# Patient Record
Sex: Female | Born: 1935 | Race: White | Hispanic: No | Smoking: Never smoker
Health system: Southern US, Community
[De-identification: ages and names within clinical notes are randomized; demographics above are authoritative.]

## PROBLEM LIST (undated history)

## (undated) DIAGNOSIS — G20 Parkinson's disease: Secondary | ICD-10-CM

## (undated) DIAGNOSIS — K279 Peptic ulcer, site unspecified, unspecified as acute or chronic, without hemorrhage or perforation: Secondary | ICD-10-CM

## (undated) DIAGNOSIS — R011 Cardiac murmur, unspecified: Secondary | ICD-10-CM

## (undated) DIAGNOSIS — I4891 Unspecified atrial fibrillation: Secondary | ICD-10-CM

## (undated) DIAGNOSIS — M810 Age-related osteoporosis without current pathological fracture: Secondary | ICD-10-CM

## (undated) DIAGNOSIS — I10 Essential (primary) hypertension: Secondary | ICD-10-CM

## (undated) DIAGNOSIS — M1990 Unspecified osteoarthritis, unspecified site: Secondary | ICD-10-CM

## (undated) DIAGNOSIS — R0602 Shortness of breath: Secondary | ICD-10-CM

## (undated) DIAGNOSIS — G709 Myoneural disorder, unspecified: Secondary | ICD-10-CM

## (undated) DIAGNOSIS — G20A1 Parkinson's disease without dyskinesia, without mention of fluctuations: Secondary | ICD-10-CM

## (undated) DIAGNOSIS — M109 Gout, unspecified: Secondary | ICD-10-CM

## (undated) DIAGNOSIS — I2510 Atherosclerotic heart disease of native coronary artery without angina pectoris: Secondary | ICD-10-CM

## (undated) DIAGNOSIS — E049 Nontoxic goiter, unspecified: Secondary | ICD-10-CM

## (undated) DIAGNOSIS — I499 Cardiac arrhythmia, unspecified: Secondary | ICD-10-CM

## (undated) DIAGNOSIS — R413 Other amnesia: Secondary | ICD-10-CM

## (undated) DIAGNOSIS — I509 Heart failure, unspecified: Secondary | ICD-10-CM

## (undated) HISTORY — PX: CHOLECYSTECTOMY: SHX55

## (undated) HISTORY — DX: Age-related osteoporosis without current pathological fracture: M81.0

## (undated) HISTORY — DX: Other amnesia: R41.3

## (undated) HISTORY — PX: APPENDECTOMY: SHX54

## (undated) HISTORY — DX: Unspecified atrial fibrillation: I48.91

## (undated) HISTORY — DX: Nontoxic goiter, unspecified: E04.9

## (undated) HISTORY — PX: OTHER SURGICAL HISTORY: SHX169

## (undated) HISTORY — PX: HIATAL HERNIA REPAIR: SHX195

## (undated) HISTORY — DX: Hypomagnesemia: E83.42

## (undated) HISTORY — DX: Peptic ulcer, site unspecified, unspecified as acute or chronic, without hemorrhage or perforation: K27.9

## (undated) HISTORY — PX: TONSILLECTOMY: SUR1361

## (undated) HISTORY — PX: RECTOCELE REPAIR: SHX761

## (undated) HISTORY — DX: Gout, unspecified: M10.9

## (undated) HISTORY — PX: ABDOMINAL HYSTERECTOMY: SHX81

## (undated) HISTORY — PX: CATARACT EXTRACTION, BILATERAL: SHX1313

## (undated) HISTORY — PX: KYPHOPLASTY: SHX5884

## (undated) HISTORY — PX: PACEMAKER PLACEMENT: SHX43

## (undated) HISTORY — DX: Unspecified osteoarthritis, unspecified site: M19.90

## (undated) HISTORY — PX: INSERT / REPLACE / REMOVE PACEMAKER: SUR710

---

## 1998-01-02 ENCOUNTER — Other Ambulatory Visit: Admission: RE | Admit: 1998-01-02 | Discharge: 1998-01-02 | Payer: Self-pay | Admitting: Family Medicine

## 1998-02-10 ENCOUNTER — Other Ambulatory Visit: Admission: RE | Admit: 1998-02-10 | Discharge: 1998-02-10 | Payer: Self-pay | Admitting: Family Medicine

## 1998-05-26 ENCOUNTER — Other Ambulatory Visit: Admission: RE | Admit: 1998-05-26 | Discharge: 1998-05-26 | Payer: Self-pay | Admitting: Obstetrics and Gynecology

## 1999-06-09 ENCOUNTER — Other Ambulatory Visit: Admission: RE | Admit: 1999-06-09 | Discharge: 1999-06-09 | Payer: Self-pay | Admitting: Obstetrics and Gynecology

## 2001-08-28 ENCOUNTER — Other Ambulatory Visit: Admission: RE | Admit: 2001-08-28 | Discharge: 2001-08-28 | Payer: Self-pay | Admitting: Obstetrics and Gynecology

## 2002-08-30 ENCOUNTER — Other Ambulatory Visit: Admission: RE | Admit: 2002-08-30 | Discharge: 2002-08-30 | Payer: Self-pay | Admitting: Obstetrics and Gynecology

## 2002-09-11 ENCOUNTER — Ambulatory Visit (HOSPITAL_COMMUNITY): Admission: RE | Admit: 2002-09-11 | Discharge: 2002-09-11 | Payer: Self-pay | Admitting: Cardiology

## 2003-07-16 ENCOUNTER — Other Ambulatory Visit: Admission: RE | Admit: 2003-07-16 | Discharge: 2003-07-16 | Payer: Self-pay | Admitting: Obstetrics and Gynecology

## 2003-10-17 ENCOUNTER — Inpatient Hospital Stay (HOSPITAL_COMMUNITY): Admission: EM | Admit: 2003-10-17 | Discharge: 2003-10-21 | Payer: Self-pay | Admitting: Emergency Medicine

## 2003-12-06 ENCOUNTER — Ambulatory Visit (HOSPITAL_COMMUNITY): Admission: RE | Admit: 2003-12-06 | Discharge: 2003-12-06 | Payer: Self-pay | Admitting: Gastroenterology

## 2003-12-06 ENCOUNTER — Encounter (INDEPENDENT_AMBULATORY_CARE_PROVIDER_SITE_OTHER): Payer: Self-pay | Admitting: Specialist

## 2004-06-26 ENCOUNTER — Encounter: Admission: RE | Admit: 2004-06-26 | Discharge: 2004-06-26 | Payer: Self-pay | Admitting: Obstetrics and Gynecology

## 2005-07-16 ENCOUNTER — Other Ambulatory Visit: Admission: RE | Admit: 2005-07-16 | Discharge: 2005-07-16 | Payer: Self-pay | Admitting: Obstetrics and Gynecology

## 2005-08-09 ENCOUNTER — Encounter: Admission: RE | Admit: 2005-08-09 | Discharge: 2005-08-09 | Payer: Self-pay | Admitting: *Deleted

## 2006-11-21 ENCOUNTER — Encounter: Admission: RE | Admit: 2006-11-21 | Discharge: 2006-11-21 | Payer: Self-pay | Admitting: Obstetrics and Gynecology

## 2007-12-12 ENCOUNTER — Encounter: Admission: RE | Admit: 2007-12-12 | Discharge: 2007-12-12 | Payer: Self-pay | Admitting: Obstetrics and Gynecology

## 2008-09-10 ENCOUNTER — Encounter: Payer: Self-pay | Admitting: Obstetrics and Gynecology

## 2008-09-12 ENCOUNTER — Ambulatory Visit (HOSPITAL_COMMUNITY): Admission: RE | Admit: 2008-09-12 | Discharge: 2008-09-12 | Payer: Self-pay | Admitting: Orthopedic Surgery

## 2008-09-18 ENCOUNTER — Ambulatory Visit (HOSPITAL_COMMUNITY): Admission: RE | Admit: 2008-09-18 | Discharge: 2008-09-18 | Payer: Self-pay | Admitting: Interventional Radiology

## 2008-09-20 ENCOUNTER — Encounter (INDEPENDENT_AMBULATORY_CARE_PROVIDER_SITE_OTHER): Payer: Self-pay | Admitting: Interventional Radiology

## 2008-09-20 ENCOUNTER — Ambulatory Visit (HOSPITAL_COMMUNITY): Admission: RE | Admit: 2008-09-20 | Discharge: 2008-09-20 | Payer: Self-pay | Admitting: Interventional Radiology

## 2008-10-10 ENCOUNTER — Encounter: Payer: Self-pay | Admitting: Interventional Radiology

## 2008-10-22 ENCOUNTER — Emergency Department (HOSPITAL_COMMUNITY): Admission: EM | Admit: 2008-10-22 | Discharge: 2008-10-22 | Payer: Self-pay | Admitting: Emergency Medicine

## 2010-04-01 ENCOUNTER — Encounter: Admission: RE | Admit: 2010-04-01 | Discharge: 2010-04-01 | Payer: Self-pay | Admitting: Internal Medicine

## 2010-08-23 ENCOUNTER — Encounter: Payer: Self-pay | Admitting: Obstetrics and Gynecology

## 2010-09-21 ENCOUNTER — Other Ambulatory Visit: Payer: Self-pay | Admitting: Gastroenterology

## 2010-09-21 ENCOUNTER — Other Ambulatory Visit (HOSPITAL_COMMUNITY)
Admission: RE | Admit: 2010-09-21 | Discharge: 2010-09-21 | Disposition: A | Discharge status: Home / Self Care | Payer: Medicare Other | Source: Ambulatory Visit | Attending: Gastroenterology | Admitting: Gastroenterology

## 2010-09-21 DIAGNOSIS — Z0189 Encounter for other specified special examinations: Secondary | ICD-10-CM | POA: Insufficient documentation

## 2010-09-25 ENCOUNTER — Other Ambulatory Visit: Payer: Self-pay | Admitting: Gastroenterology

## 2010-09-25 DIAGNOSIS — R634 Abnormal weight loss: Secondary | ICD-10-CM

## 2010-10-01 ENCOUNTER — Ambulatory Visit
Admission: RE | Admit: 2010-10-01 | Discharge: 2010-10-01 | Disposition: A | Discharge status: Home / Self Care | Payer: PRIVATE HEALTH INSURANCE | Source: Ambulatory Visit | Attending: Gastroenterology | Admitting: Gastroenterology

## 2010-10-01 DIAGNOSIS — R634 Abnormal weight loss: Secondary | ICD-10-CM

## 2010-10-01 MED ORDER — IOHEXOL 300 MG/ML  SOLN
100.0000 mL | Freq: Once | INTRAMUSCULAR | Status: AC | PRN
  Administered 2010-10-01: 100 mL via INTRAVENOUS

## 2010-10-14 ENCOUNTER — Emergency Department (HOSPITAL_COMMUNITY): Payer: Medicare Other

## 2010-10-14 ENCOUNTER — Ambulatory Visit (HOSPITAL_COMMUNITY)
Admission: RE | Admit: 2010-10-14 | Discharge: 2010-10-14 | Disposition: A | Discharge status: Home / Self Care | Payer: Medicare Other | Source: Ambulatory Visit | Attending: Gastroenterology | Admitting: Gastroenterology

## 2010-10-14 ENCOUNTER — Other Ambulatory Visit: Payer: Self-pay | Admitting: Gastroenterology

## 2010-10-14 ENCOUNTER — Emergency Department (HOSPITAL_COMMUNITY)
Admission: EM | Admit: 2010-10-14 | Discharge: 2010-10-15 | Disposition: A | Discharge status: Other Acute Inpatient Hospital | Payer: Medicare Other | Source: Home / Self Care | Attending: Emergency Medicine | Admitting: Emergency Medicine

## 2010-10-14 DIAGNOSIS — Z7901 Long term (current) use of anticoagulants: Secondary | ICD-10-CM | POA: Insufficient documentation

## 2010-10-14 DIAGNOSIS — R197 Diarrhea, unspecified: Secondary | ICD-10-CM | POA: Diagnosis present

## 2010-10-14 DIAGNOSIS — I129 Hypertensive chronic kidney disease with stage 1 through stage 4 chronic kidney disease, or unspecified chronic kidney disease: Secondary | ICD-10-CM | POA: Insufficient documentation

## 2010-10-14 DIAGNOSIS — Z85828 Personal history of other malignant neoplasm of skin: Secondary | ICD-10-CM | POA: Insufficient documentation

## 2010-10-14 DIAGNOSIS — M6282 Rhabdomyolysis: Secondary | ICD-10-CM | POA: Diagnosis present

## 2010-10-14 DIAGNOSIS — N189 Chronic kidney disease, unspecified: Secondary | ICD-10-CM | POA: Diagnosis present

## 2010-10-14 DIAGNOSIS — M810 Age-related osteoporosis without current pathological fracture: Secondary | ICD-10-CM | POA: Insufficient documentation

## 2010-10-14 DIAGNOSIS — N179 Acute kidney failure, unspecified: Secondary | ICD-10-CM | POA: Diagnosis present

## 2010-10-14 DIAGNOSIS — Z79899 Other long term (current) drug therapy: Secondary | ICD-10-CM | POA: Insufficient documentation

## 2010-10-14 DIAGNOSIS — R55 Syncope and collapse: Principal | ICD-10-CM | POA: Diagnosis present

## 2010-10-14 DIAGNOSIS — K862 Cyst of pancreas: Secondary | ICD-10-CM | POA: Insufficient documentation

## 2010-10-14 DIAGNOSIS — I4891 Unspecified atrial fibrillation: Secondary | ICD-10-CM | POA: Diagnosis present

## 2010-10-14 DIAGNOSIS — Z950 Presence of cardiac pacemaker: Secondary | ICD-10-CM

## 2010-10-14 DIAGNOSIS — I509 Heart failure, unspecified: Secondary | ICD-10-CM | POA: Diagnosis present

## 2010-10-14 DIAGNOSIS — I5040 Unspecified combined systolic (congestive) and diastolic (congestive) heart failure: Secondary | ICD-10-CM | POA: Diagnosis present

## 2010-10-14 DIAGNOSIS — M109 Gout, unspecified: Secondary | ICD-10-CM | POA: Insufficient documentation

## 2010-10-14 DIAGNOSIS — N318 Other neuromuscular dysfunction of bladder: Secondary | ICD-10-CM | POA: Insufficient documentation

## 2010-10-14 DIAGNOSIS — K863 Pseudocyst of pancreas: Secondary | ICD-10-CM | POA: Insufficient documentation

## 2010-10-14 DIAGNOSIS — A0472 Enterocolitis due to Clostridium difficile, not specified as recurrent: Secondary | ICD-10-CM | POA: Diagnosis present

## 2010-10-14 DIAGNOSIS — I5032 Chronic diastolic (congestive) heart failure: Secondary | ICD-10-CM | POA: Insufficient documentation

## 2010-10-14 DIAGNOSIS — N184 Chronic kidney disease, stage 4 (severe): Secondary | ICD-10-CM | POA: Insufficient documentation

## 2010-10-14 LAB — CBC
HCT: 33.2 % — ABNORMAL LOW (ref 36.0–46.0)
Hemoglobin: 11.2 g/dL — ABNORMAL LOW (ref 12.0–15.0)
MCH: 31.1 pg (ref 26.0–34.0)
MCHC: 33.7 g/dL (ref 30.0–36.0)
MCV: 92.2 fL (ref 78.0–100.0)
Platelets: 134 10*3/uL — ABNORMAL LOW (ref 150–400)
RBC: 3.6 MIL/uL — ABNORMAL LOW (ref 3.87–5.11)
RDW: 13.5 % (ref 11.5–15.5)
WBC: 11.2 10*3/uL — ABNORMAL HIGH (ref 4.0–10.5)

## 2010-10-14 LAB — BASIC METABOLIC PANEL
BUN: 39 mg/dL — ABNORMAL HIGH (ref 6–23)
CO2: 28 mEq/L (ref 19–32)
Calcium: 9.7 mg/dL (ref 8.4–10.5)
Chloride: 104 mEq/L (ref 96–112)
Creatinine, Ser: 1.51 mg/dL — ABNORMAL HIGH (ref 0.4–1.2)
GFR calc Af Amer: 41 mL/min — ABNORMAL LOW (ref >60)
GFR calc non Af Amer: 34 mL/min — ABNORMAL LOW (ref >60)
Glucose, Bld: 103 mg/dL — ABNORMAL HIGH (ref 70–99)
Potassium: 4.5 mEq/L (ref 3.5–5.1)
Sodium: 140 mEq/L (ref 135–145)

## 2010-10-14 LAB — PANC CYST FLD ANLYS-PATHFNDR-TG

## 2010-10-15 ENCOUNTER — Observation Stay (HOSPITAL_COMMUNITY): Payer: Medicare Other

## 2010-10-15 ENCOUNTER — Emergency Department (HOSPITAL_COMMUNITY): Payer: Medicare Other

## 2010-10-15 ENCOUNTER — Encounter (HOSPITAL_COMMUNITY): Payer: Self-pay

## 2010-10-15 ENCOUNTER — Inpatient Hospital Stay (HOSPITAL_COMMUNITY)
Admission: EM | Admit: 2010-10-15 | Discharge: 2010-10-17 | DRG: 312 | Disposition: A | Discharge status: Home Health | Payer: Medicare Other | Source: Other Acute Inpatient Hospital | Attending: Internal Medicine | Admitting: Internal Medicine

## 2010-10-15 LAB — DIFFERENTIAL
Basophils Absolute: 0 10*3/uL (ref 0.0–0.1)
Basophils Absolute: 0 10*3/uL (ref 0.0–0.1)
Basophils Relative: 0 % (ref 0–1)
Eosinophils Absolute: 0 10*3/uL (ref 0.0–0.7)
Eosinophils Absolute: 0.1 10*3/uL (ref 0.0–0.7)
Eosinophils Relative: 0 % (ref 0–5)
Eosinophils Relative: 2 % (ref 0–5)
Lymphocytes Relative: 10 % — ABNORMAL LOW (ref 12–46)
Lymphocytes Relative: 20 % (ref 12–46)
Lymphs Abs: 1.1 10*3/uL (ref 0.7–4.0)
Lymphs Abs: 1.2 10*3/uL (ref 0.7–4.0)
Monocytes Absolute: 0.5 10*3/uL (ref 0.1–1.0)
Monocytes Absolute: 0.7 10*3/uL (ref 0.1–1.0)
Monocytes Relative: 6 % (ref 3–12)
Neutro Abs: 9.4 10*3/uL — ABNORMAL HIGH (ref 1.7–7.7)
Neutrophils Relative %: 84 % — ABNORMAL HIGH (ref 43–77)

## 2010-10-15 LAB — BASIC METABOLIC PANEL
BUN: 36 mg/dL — ABNORMAL HIGH (ref 6–23)
BUN: 39 mg/dL — ABNORMAL HIGH (ref 6–23)
Chloride: 104 mEq/L (ref 96–112)
Chloride: 104 mEq/L (ref 96–112)
GFR calc Af Amer: 38 mL/min — ABNORMAL LOW (ref >60)
GFR calc non Af Amer: 28 mL/min — ABNORMAL LOW (ref >60)
GFR calc non Af Amer: 32 mL/min — ABNORMAL LOW (ref >60)
Potassium: 3.9 mEq/L (ref 3.5–5.1)
Potassium: 4.4 mEq/L (ref 3.5–5.1)
Sodium: 137 mEq/L (ref 135–145)

## 2010-10-15 LAB — POCT CARDIAC MARKERS
CKMB, poc: 14.9 ng/mL (ref 1.0–8.0)
Myoglobin, poc: >500 ng/mL (ref 12–200)
Troponin i, poc: <0.05 ng/mL (ref 0.00–0.09)

## 2010-10-15 LAB — CLOSTRIDIUM DIFFICILE BY PCR

## 2010-10-15 LAB — CBC
HCT: 31.7 % — ABNORMAL LOW (ref 36.0–46.0)
Hemoglobin: 10.6 g/dL — ABNORMAL LOW (ref 12.0–15.0)
MCH: 30.3 pg (ref 26.0–34.0)
MCHC: 33.4 g/dL (ref 30.0–36.0)
MCV: 90.6 fL (ref 78.0–100.0)
RBC: 3.5 MIL/uL — ABNORMAL LOW (ref 3.87–5.11)

## 2010-10-15 LAB — CARDIAC PANEL(CRET KIN+CKTOT+MB+TROPI)
Relative Index: 2.8 — ABNORMAL HIGH (ref 0.0–2.5)
Total CK: 835 U/L — ABNORMAL HIGH (ref 7–177)
Troponin I: 0.05 ng/mL (ref 0.00–0.06)

## 2010-10-15 LAB — TROPONIN I: Troponin I: 0.06 ng/mL (ref 0.00–0.06)

## 2010-10-15 LAB — URINALYSIS, DIPSTICK ONLY
Bilirubin Urine: NEGATIVE
Nitrite: NEGATIVE
Protein, ur: NEGATIVE mg/dL
Urobilinogen, UA: 0.2 mg/dL (ref 0.0–1.0)

## 2010-10-15 LAB — PROTIME-INR
INR: 1.26 (ref 0.00–1.49)
INR: 1.27 (ref 0.00–1.49)
Prothrombin Time: 16.1 seconds — ABNORMAL HIGH (ref 11.6–15.2)

## 2010-10-15 LAB — APTT: aPTT: 32 seconds (ref 24–37)

## 2010-10-15 LAB — CK TOTAL AND CKMB (NOT AT ARMC)
CK, MB: 20.5 ng/mL (ref 0.3–4.0)
Relative Index: 3.1 — ABNORMAL HIGH (ref 0.0–2.5)
Total CK: 651 U/L — ABNORMAL HIGH (ref 7–177)
Total CK: 847 U/L — ABNORMAL HIGH (ref 7–177)

## 2010-10-15 LAB — BRAIN NATRIURETIC PEPTIDE: Pro B Natriuretic peptide (BNP): 918 pg/mL — ABNORMAL HIGH (ref 0.0–100.0)

## 2010-10-15 MED ORDER — TECHNETIUM TO 99M ALBUMIN AGGREGATED
6.0000 | Freq: Once | INTRAVENOUS | Status: AC | PRN
  Administered 2010-10-15: 16:00:00 6 via INTRAVENOUS

## 2010-10-15 MED ORDER — XENON XE 133 GAS
10.0000 | GAS_FOR_INHALATION | Freq: Once | RESPIRATORY_TRACT | Status: AC | PRN
  Administered 2010-10-15: 10 via RESPIRATORY_TRACT

## 2010-10-16 ENCOUNTER — Inpatient Hospital Stay (HOSPITAL_COMMUNITY): Payer: Medicare Other

## 2010-10-16 LAB — CARDIAC PANEL(CRET KIN+CKTOT+MB+TROPI): Troponin I: 0.04 ng/mL (ref 0.00–0.06)

## 2010-10-16 LAB — BASIC METABOLIC PANEL
CO2: 31 mEq/L (ref 19–32)
Chloride: 103 mEq/L (ref 96–112)
GFR calc non Af Amer: 23 mL/min — ABNORMAL LOW (ref >60)
Glucose, Bld: 113 mg/dL — ABNORMAL HIGH (ref 70–99)
Potassium: 3.9 mEq/L (ref 3.5–5.1)
Sodium: 139 mEq/L (ref 135–145)

## 2010-10-16 LAB — DIFFERENTIAL
Lymphs Abs: 1.6 10*3/uL (ref 0.7–4.0)
Monocytes Absolute: 0.5 10*3/uL (ref 0.1–1.0)
Monocytes Relative: 9 % (ref 3–12)
Neutro Abs: 3.5 10*3/uL (ref 1.7–7.7)
Neutrophils Relative %: 59 % (ref 43–77)

## 2010-10-16 LAB — CBC
HCT: 30.2 % — ABNORMAL LOW (ref 36.0–46.0)
Hemoglobin: 10.2 g/dL — ABNORMAL LOW (ref 12.0–15.0)
MCH: 30.5 pg (ref 26.0–34.0)
MCV: 90.4 fL (ref 78.0–100.0)
RBC: 3.34 MIL/uL — ABNORMAL LOW (ref 3.87–5.11)

## 2010-10-16 LAB — OVA AND PARASITE EXAMINATION

## 2010-10-17 LAB — BASIC METABOLIC PANEL
BUN: 46 mg/dL — ABNORMAL HIGH (ref 6–23)
Chloride: 100 mEq/L (ref 96–112)
Creatinine, Ser: 1.72 mg/dL — ABNORMAL HIGH (ref 0.4–1.2)

## 2010-10-18 LAB — STOOL CULTURE

## 2010-10-22 NOTE — H&P (Signed)
[name], [name]                 ACCOUNT NO.:  [id number]  MEDICAL RECORD NO.:  [id number]           PATIENT TYPE:  O  LOCATION:  6533                         FACILITY:  [facility]  PHYSICIAN:  Melinda Massed, MD    DATE OF BIRTH:  08/26/1932  DATE OF ADMISSION:  10/15/2010 DATE OF DISCHARGE:                             HISTORY & PHYSICAL   PRIMARY CARE PRACTITIONER:  Dr. Nathanial Hickman in [place].  PRIMARY CARDIOLOGIST:  Melinda Records, MD.  PRIMARY NEPHROLOGIST:  Melinda Rhodes, MD  CHIEF COMPLAINT:  Syncope.  HISTORY OF PRESENT ILLNESS:  The patient is a very pleasant 77 year old female with a past medical history of atrial fibrillation, on chronic Coumadin therapy, history of CHF, history of a permanent pacemaker implantation, history of chronic diarrhea, chronic kidney disease, who underwent an endoscopic ultrasound with FNA of pancreas yesterday, following which she was sent home and apparently was in her usual state of health.  The patient does not exactly recollect the events when she went home, but she thinks around 3-4 o'clock in the afternoon, she passed out.  The down time is unknown.  However, when the patient came to, she felt weak and could not get up on her own.  Weakness was generalized rather than focal in nature.  She was finally able around 7 o'clock in the evening to drag herself to the couch and call her family members, who then called EMS and the patient was brought to the hospital.  The patient does not have a history of prior seizure disorders.  She claims she has never passed out before.  She denies any chest pain or palpitations.  As noted above, she does have a history of chronic diarrhea.  The patient claims that the total time that she was on the floor was around 3-4 hours approximately.  She was then brought to [facility] and then transferred to [facility] for further evaluation and treatment.  ALLERGIES:  None.  PAST MEDICAL  HISTORY:  Significant for: 1. Atrial fibrillation on chronic Coumadin therapy. 2. CHF. 3. Status post permanent pacemaker implantation. 4. Chronic kidney disease. 5. Hypertension. 6. Chronic Coumadin therapy. 7. Questionable history of gout. 8. Osteoporosis. 9. History of migraine syndrome. 10.Questionable irritable bowel syndrome.  PAST SURGICAL HISTORY: 1. Status post tonsillectomy. 2. Status post hysterectomy. 3. Status post abdominal hernia repair.  MEDICATIONS AT HOME:  The patient does not remember the name of all the medications apparently.  However, she is on the following meds: 1. Warfarin. 2. Metoprolol. 3. Micardis. 4. Lasix. 5. Amiloride. 6. Allopurinol. 7. Ciprofloxacin.  FAMILY HISTORY:  Her mother died at 73 from an MI.  Mother also had diabetes.  She had two brothers, both of which had prostate cancer.  SOCIAL HISTORY:  She lives alone.  She denies any toxic habits.  REVIEW OF SYSTEMS:  A detailed review of 12 systems were done and these are negative except for the ones mentioned in the HPI.  PHYSICAL EXAMINATION:  VITAL SIGNS:  Afebrile, pulse of 75, blood pressure 121/66, O2 saturation of 97% on room air. GENERAL:  An elderly  white female, lying in bed, does not appear to be in any distress, is awake and alert.  Speech is clear. HEENT:  Atraumatic and normocephalic.  Pupils equally reactive to light and accommodation. NECK:  Supple. CHEST:  Bilaterally clear to auscultation. CARDIOVASCULAR:  Heart sounds are regular.  No murmurs heard. ABDOMEN:  Soft, nontender, nondistended. EXTREMITIES:  Trace pedal edema.  Both of her lower extremities are warm to touch. NEUROLOGIC:  The patient is awake and alert.  Speech is clear, and she has no focal neurological deficits.  Her cranial nerves from II through XII are grossly intact.  LABORATORY DATA: 1. Fecal occult blood is negative. 2. Troponins; the first troponin is 0.06 and the second troponin is      0.07. 3. Her total CK is 651 and the MB fraction is 20.5. 4. Chemistry shows sodium of 137, potassium of 4.4, chloride of 104,     bicarb of 26, glucose of 133, BUN of 39, creatinine of 1.78, and a     calcium of 9.4. 5. CBC shows WBC of 11.2, hemoglobin of 11.2, hematocrit of 33.2, and     platelet count of 134. 6. INR is 1.27.  RADIOLOGICAL STUDIES: 1. CT of the head without contrast shows no evidence of traumatic     intracranial injury or fracture.  Diffuse small vessel ischemic     microangiopathy and small chronic lacunar infarct in the posterior     limb of the right internal capsule. 2. X-ray of the chest shows cardiac enlargement.  No evidence of     active pulmonary disease.  ASSESSMENT: 1. Syncopal episode of unknown etiology.  Given her longstanding     cardiac issues, we will need to rule out cardiac etiology.  The     patient has no history of seizure disorder.  She does not have any     predisposing factors that will put her at risk for having seizures,     so this is unlikely at this point in time as well. 2. Mild rhabdomyolysis.  This is probably secondary to her being on     the floor for at least 3-4 hours. 3. Likely, mild acute on chronic kidney disease. 4. 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 5. History of atrial fibrillation, on chronic Coumadin therapy with a     subtherapeutic INR as her INR was held for the EUS. 6. History of congestive heart failure, unknown at this time whether     this is diastolic or systolic in nature. 7. History of hypertension, currently controlled.  PLAN: 1. This patient will be admitted to the telemetry floor. 2. She will be gently hydrated. 3. Since the CT of the head is negative, we will go ahead and resume     Coumadin per GI note from EUS procedure.  It was okay to resume     Coumadin from today, and there is no evidence of bleeding, even a     fecal  occult blood is negative. 4. An MRI of her brain as well as an EEG will be ordered. 5. Her pacemaker with probably need to be interrogated.  We will     discuss with Cardiology. 6. Her cardiac enzymes will continue to be cycled. 7. We will also obtain her D-dimer. 8. Further plan will depend as the patient's clinical course evolves,     further input from Cardiology, and  as per the results from the     laboratory and radiological studies become available.  CODE STATUS:  The patient is a full code.  Total time spent equals 45 minutes.     Melinda Massed, MD     SG/MEDQ  D:  10/15/2010  T:  10/15/2010  Job:  [id number]  cc:   Burnell Blanks, MD [name], M.D. [name], M.D. Willis Modena, MD  Electronically Signed by [name]  on 10/22/2010 08:35:02 PM

## 2010-10-22 NOTE — Discharge Summary (Signed)
[name], [name]                 ACCOUNT NO.:  [id number]  MEDICAL RECORD NO.:  [id number]           PATIENT TYPE:  I  LOCATION:  3737                         FACILITY:  [facility]  PHYSICIAN:  Jeoffrey Massed, MD    DATE OF BIRTH:  10-30-1932  DATE OF ADMISSION:  10/15/2010 DATE OF DISCHARGE:  10/17/2010                        DISCHARGE SUMMARY - REFERRING   PRIMARY CARE PRACTITIONER:  Burnell Blanks, MD at [facility].  PRIMARY CARDIOLOGIST:  Lyn Records, MD  PRIMARY NEPHROLOGIST:  Terrial Rhodes, MD  PRIMARY DISCHARGE DIAGNOSES: 1. Syncope of uncertain etiology.  Workup so far negative. 2. C. difficile colitis. 3. Mild rhabdomyolysis. 4. Mild acute on chronic kidney disease.  SECONDARY DISCHARGE DIAGNOSES: 1. Chronic atrial fibrillation on chronic Coumadin therapy. 2. Hypertension. 3. CHF with systolic and diastolic dysfunction. 4. History of chronic diarrhea. 5. 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  Subsequent biopsy results     have come back and they show no malignant cells.  DISCHARGE MEDICATIONS: 1. Flagyl 500 mg 1 tablet p.o. 3 times a day for 8 more days. 2. Amiloride 5 mg 1 tablet p.o. daily. 3. Calcium carbonate over-the-counter 1 tablet p.o. twice daily. 4. Coumadin 3 mg 1 tablet on Mondays, Wednesdays, and Fridays and half     a tablet on Tuesdays, Thursdays, Saturdays, and Sundays. 5. Ferrous sulfate 1 tablet p.o. twice daily. 6. Lasix 40 mg 1 tablet p.o. daily. 7. Magnesium oxide 3 tablets p.o. twice daily. 8. Lopressor 50 mg 1 tablet daily. 9. Micardis 1 tablet p.o. daily.  CONSULTATIONS:  None.  BRIEF HISTORY OF PRESENT ILLNESS:  The patient is a very pleasant 77- year-old female with a past medical history of atrial fibrillation on chronic Coumadin therapy, CHF, history of permanent pacemaker implantation, underwent a endoscopic ultrasound on the day of admission for a cystic  lesion in pancreas following which she went home and had a syncopal episode.  The patient was then brought to the emergency room for further evaluation and treatment.  For further details, please see the history and physical that was dictated by me on admission.  PERTINENT RADIOLOGICAL STUDIES: 1. CT of the head without contrast done on October 15 1010 shows no     evidence of traumatic intracranial injury or fracture. 2. X-ray of the chest shows stable cardiomegaly.  Dual lead cardiac     pacemaker in place.  No active lung disease. 3. A V/Q scan done on October 15, 2010 was low probability of pulmonary     embolism.  DISCHARGE LABORATORY DATA: 1. sodium of 140, potassium of 3.9, chloride of 100, bicarb of 30,     glucose of 105, BUN of 46, creatinine of 1.72, and a calcium of     9.0. 2. Last INR was 1.30. 3. Cardiac enzymes were cycled and the troponin's were negative.     However, the patient did have some elevation in her creatinine     kinase levels with last level being 571. 4. Stool C. diff PCR was positive.  BRIEF HOSPITAL  COURSE: 1. Syncope.  The patient was brought into the hospital as she had a     syncopal event after she returned home from the EUS with FNA     procedure.  The exact downtime is unknown.  However, the patient     did have difficulty getting up when she fell.  The patient was     admitted on telemetry and workup was initiated.  The pacemaker     interrogation was also done which did not show any abnormalities.     A CT of the head was also negative.  The patient does not have a     history of seizure disorder nor does have any risk factor that     would predispose her to any seizures and the EEG was also done that     did not show any epilepsy like waveforms.  It is at this time     thought that perhaps this was related to the ongoing diarrhea, the     patient being n.p.o. and perhaps some sedative agents lingering in     her system after the EUS.  We have  not been able to discern any     significant abnormality that would have caused her to have a     syncopal event.  A 2-D echo has been done this admission, however,     it is currently pending and will need followup when she visits her     cardiologist.  Per Dr. Corky Sing note when he did a social     visit, the patient has known both systolic and diastolic heart     failure.  At this time, the patient will be resumed on all her     prior medication that she was taking including Coumadin. 2. Diarrhea.  Apparently this patient has a history of chronic     diarrhea for numerous months to years.  She claims that she has had     a workup in the past that was negative.  In any event, stool C.     diff PCR was sent and it did come back positive.  Hence the patient     has been started on Flagyl and she will continue it for another 8     more days. 3. Atrial fibrillation.  The patient has a history of chronic atrial     fibrillation and has been on Coumadin which was, however, held as     the patient was undergoing EUS with FNA.  Following admission, this     was restarted.  The patient will resume her usual dose of Coumadin.     At this point, the plan is to have home health services and an RN,     to the patient's place this coming Monday to draw blood for an INR     check.  That will be then called in to her primary cardiologist's     office for further optimization of her Coumadin and INR levels.  4.     CHF.  Per Dr. Michaelle Copas note, the patient has a history of known     systolic and diastolic CHF.  This was more or less compensated     during her stay.  As noted above, a 2-D echocardiogram is still     pending and this will need to be followed up when she visits Dr.     Katrinka Blazing at the next followup appointment.  I have  told the patient to     call Dr. Katrinka Blazing and make a followup appointment in the next 5-7     days.  The patient does have a history of chronic kidney disease     and she has  been maintained as an outpatient on Lasix, on     amiloride, Micardis, and metoprolol all of which are being     continued on the day of discharge. 4. Mild rhabdomyolysis.  This was secondary to the patient being on     the floor for approximately 3 hours.  She was very gently hydrated     on the day of admission, given a history of both systolic and     diastolic heart failure.  Further hydration was stopped.  Her last     CPK is 571. 5. Mild acute on chronic kidney disease.  This resolved with     hydration.  Apparently this patient does see Dr. Arrie Aran as an     outpatient.  We have on discharge resumed all of the usual     medications and she will need appropriate monitoring of her renal     function as she is on ACE inhibitor as well as numerous diuretics.  DISPOSITION:  At this point, the patient is considered stable to be discharged home.  FOLLOWUP INSTRUCTIONS: 1. INR will be drawn by a home RN this coming Monday and then will be     called into Dr. Corky Sing office for further optimization and     titration of her Coumadin dose. 2. The patient is to call [facility] and make an appointment     with Dr. Katrinka Blazing within 1 week upon discharge. 3. The patient is also to call her primary care practitioner, Dr.     Nathanial Rancher and make an appointment within 1-2 weeks upon discharge.  Total time spent equals 45 minutes.     Jeoffrey Massed, MD     SG/MEDQ  D:  10/17/2010  T:  10/17/2010  Job:  [id number]  cc:   Burnell Blanks, MD Lyn Records, M.D. Terrial Rhodes, M.D.  Electronically Signed by Jeoffrey Massed  on 10/22/2010 08:35:15 PM

## 2010-11-12 LAB — POCT I-STAT, CHEM 8
BUN: 51 mg/dL — ABNORMAL HIGH (ref 6–23)
Calcium, Ion: 1.13 mmol/L (ref 1.12–1.32)
Chloride: 104 meq/L (ref 96–112)
Creatinine, Ser: 2.4 mg/dL — ABNORMAL HIGH (ref 0.4–1.2)
Glucose, Bld: 99 mg/dL (ref 70–99)
HCT: 36 % (ref 36.0–46.0)
Hemoglobin: 12.2 g/dL (ref 12.0–15.0)
Potassium: 5.3 mEq/L — ABNORMAL HIGH (ref 3.5–5.1)
Sodium: 138 meq/L (ref 135–145)
TCO2: 27 mmol/L (ref 0–100)

## 2010-11-12 LAB — PROTIME-INR: Prothrombin Time: 46.6 seconds — ABNORMAL HIGH (ref 11.6–15.2)

## 2010-11-17 LAB — COMPREHENSIVE METABOLIC PANEL
ALT: 17 U/L (ref 0–35)
AST: 25 U/L (ref 0–37)
BUN: 33 mg/dL — ABNORMAL HIGH (ref 6–23)
Calcium: 9.4 mg/dL (ref 8.4–10.5)
Calcium: 9.4 mg/dL (ref 8.4–10.5)
Creatinine, Ser: 1.75 mg/dL — ABNORMAL HIGH (ref 0.4–1.2)
Creatinine, Ser: 1.85 mg/dL — ABNORMAL HIGH (ref 0.4–1.2)
GFR calc Af Amer: 34 mL/min — ABNORMAL LOW (ref >60)
Glucose, Bld: 99 mg/dL (ref 70–99)
Sodium: 141 mEq/L (ref 135–145)
Sodium: 142 mEq/L (ref 135–145)
Total Protein: 7 g/dL (ref 6.0–8.3)
Total Protein: 7.3 g/dL (ref 6.0–8.3)

## 2010-11-17 LAB — APTT
aPTT: 30 seconds (ref 24–37)
aPTT: 32 seconds (ref 24–37)

## 2010-11-17 LAB — PROTIME-INR
INR: 1.3 (ref 0.00–1.49)
Prothrombin Time: 16 seconds — ABNORMAL HIGH (ref 11.6–15.2)
Prothrombin Time: 17 seconds — ABNORMAL HIGH (ref 11.6–15.2)

## 2010-11-17 LAB — CBC
HCT: 36.9 % (ref 36.0–46.0)
Hemoglobin: 12.4 g/dL (ref 12.0–15.0)
MCHC: 33.8 g/dL (ref 30.0–36.0)
MCV: 96.6 fL (ref 78.0–100.0)
RDW: 15.5 % (ref 11.5–15.5)

## 2010-12-15 NOTE — Consult Note (Signed)
[name], [name]                 ACCOUNT NO.:  [id number]   MEDICAL RECORD NO.:  [id number]          PATIENT TYPE:  OUT   LOCATION:  XRAY                         FACILITY:  [facility]   PHYSICIAN:  Sanjeev K. Deveshwar, M.D.DATE OF BIRTH:  Feb 03, 1933   DATE OF CONSULTATION:  10/10/2008  DATE OF DISCHARGE:                                 CONSULTATION   CHIEF COMPLAINT:  Status post L1-L2 kyphoplasty performed on September 20, 2008.   BRIEF HISTORY OF PRESENT ILLNESS:  This is a very pleasant 77 year old  female with a long history of back pain.  She was referred to Dr.  Corliss Skains through the courtesy of her GYN physician, Dr. Richarda Overlie.  She had had some plain films taken at an outside facility in  2007 which revealed some old compression fractures.  The patient has a  permanent pacemaker, so she was unable to have an MRI performed.  She  did have lumbar spine films on September 12, 2008, that showed  compression fractures at L1 and L2.  She had also had a bone scan that  correlated with this area showing increased uptake at L1 and L2.  Also,  noted on a plain films was osteopenia and degenerative scoliosis and  spondylolisthesis.  The patient eventually underwent kyphoplasty to the  L1 and L2 levels on September 20, 2008.  She returns today to be seen in  followup.  She is on Coumadin for atrial fibrillation, this was  discontinued for the intervention.   PAST MEDICAL HISTORY:  Significant for osteoporosis, chronic atrial  fibrillation with previous AV nodal ablation.  She has a permanent  pacemaker that was replaced in February 2004 with a St. Jude device.  She is on chronic Coumadin therapy.  She had a protime today performed  at the [place] office, the results are currently pending.  She has  chronically low magnesium and is on a supplement for this problem.  She  has a history of migraine headaches, irritable bowel syndrome, seasonal  allergies, palpitations, diarrhea, history  of cirrhosis, and a previous  colonoscopy in 2005.   SURGICAL HISTORY:  The patient is status post tonsillectomy, status post  hernia repair, status post hysterectomy, and status post foot surgery.  She denies any previous problems with anesthesia.   ALLERGIES:  No known drug allergies.   CURRENT MEDICATIONS:  Coumadin, magnesium oxide, Digitek, allopurinol,  benazepril, Lasix, amiloride, metoprolol, and Boniva.  The patient did  not know the doses or frequencies of her medications.   SOCIAL HISTORY:  The patient lives alone outside [place].  She has been  widowed for several years.  She has 2 supportive daughters as well as  several grandchildren.  The patient has never smoked.  She does not use  alcohol.  She is retired from [employer] work.  She stays fairly active.   FAMILY HISTORY:  The patient's mother died at age 61 from an MI, she had  diabetes, she apparently was noncompliant with her medications.  Her  father died at age 75 from an MI.  She had 3 brothers  and a sister, all  of whom died from cancer.   IMPRESSION AND PLAN:  As noted, the patient returns today to be seen in  followup after undergoing kyphoplasties at L1 and L2 levels on September 20, 2008, for painful compression fractures.  Biopsies were performed  during that study.  The biopsies were negative for malignancy.   The patient reports that her back feels significantly improved.  She is  very pleased with the results.  She is currently able to ambulate with a  cane and is getting around quite well at this time.   The patient does report that she has had some blurred vision over the  past week.  She did see Lavinia Sharps, family nurse practitioner,  today.  A protime is pending to check her Coumadin level.  Apparently,  she has also been advised to see her optometrist or ophthalmologist for  an eye exam.   Dr. Corliss Skains reviewed the images from the kyphoplasties with the  patient.  All of her questions were  answered.  Greater than 15 minutes  was spent on this followup visit.  Further followup will be on a p.r.n.  basis.      Delton See, P.A.    ______________________________  Grandville Silos. Corliss Skains, M.D.    DR/MEDQ  D:  10/10/2008  T:  10/11/2008  Job:  [id number]   cc:   Chales Abrahams Placey, N.P.  Richard M. Marcelle Overlie, M.D.  Lyn Records, M.D.

## 2010-12-15 NOTE — Consult Note (Signed)
[name], [name]                 ACCOUNT NO.:  [id number]   MEDICAL RECORD NO.:  [id number]          PATIENT TYPE:  OUT   LOCATION:  XRAY                         FACILITY:  [facility]   PHYSICIAN:  Sanjeev K. Deveshwar, M.D.DATE OF BIRTH:  [birth year]   DATE OF CONSULTATION:  09/10/2008  DATE OF DISCHARGE:                                 CONSULTATION   CHIEF COMPLAINT:  Back pain.   HISTORY OF PRESENT ILLNESS:  This is a very pleasant 77 year old female  who has a long history of back pain dating back at least 20 years.  The  patient states that she injured her back about 20 years ago.  This  eventually improved.  Approximately 3 years ago, she fell and injured  her back again.  She has had intermittent pain since that time.  However, over the past 6 months, the pain has become increasingly  severe.  She describes the pain as being an 8 or 9/10 now with activity,  although she is able to find some relief when she is very sedentary.  The patient does receive some relief with Tylenol.  She is very active.  She has grandchildren with whom she likes to visit and travel.  However,  she has been severely limited in her activities due to her pain.  She  presents today for further evaluation by Dr. Corliss Skains.   PAST MEDICAL HISTORY:  The patient does have a history of osteoporosis.  She has chronic atrial fibrillation.  She is status post AV nodal  ablation.  She has a permanent pacemaker that was replaced in February  2004 with a St. Jude device.  She is on chronic Coumadin therapy.  She  has chronically low magnesium and is on a supplement for this problem.  She has a history of migraine headaches, irritable bowel syndrome, and  seasonal allergies.  She had a colonoscopy in 2005.  She has a history  of palpitations.  She has a history of diarrhea.  There is also  questionable history of cirrhosis.   SURGICAL HISTORY:  The patient is status post tonsillectomy, status post  hernia repair,  status post hysterectomy, and status post foot surgery.  She denies any previous problems with anesthesia.   ALLERGIES:  No known drug allergies.   CURRENT MEDICATIONS:  Coumadin, magnesium oxide, Digitek, allopurinol,  benazepril, Lasix, amiloride, metoprolol, and Boniva.  Unfortunately, we  do not have the doses and the frequencies for these medications.   SOCIAL HISTORY:  The patient lives alone outside [place].  She has been  widowed for several years now.  She has 2 supportive daughters as well  as several grandchildren.  She has never smoked.  She does not use  alcohol.  She is retired from [employer].  She tries to stay active.   FAMILY HISTORY:  Mother died at age 59 from an MI.  She had diabetes.  She apparently was noncompliant with her medications.  Her father died  at age 35 from an MI.  She had 2 brothers and a sister, all of whom died  from cancer.  IMPRESSION AND PLAN:  As noted, the patient presents today for  evaluation of her back pain.  She indicates that her back pain is low in  the spine and the lumbar area.  She has had no recent study.  She did  bring with her a disc containing some plain films that were taken in  2007.  Dr. Corliss Skains reviewed these images and noted at least 2 old  compression fractures in the lumbar area.   Compression fractures were discussed in detail with the patient along  with treatment options including conservative management with pain  medications and sedentary lifestyle versus kyphoplasty or vertebroplasty  procedures.  The kyphoplasty procedure was also described in detail  along with the risks and benefits.  The patient is very anxious to have  some relief, so that she can resume her normal activities.  She cannot  have an MRI due to her permanent pacemaker.  Dr. Corliss Skains has  recommended a bone scan.  If this shows any sign of a fracture, she  would also need a CT scan for localization.   The patient understands that she  would need to come off her Coumadin in  order to have a vertebroplasty or kyphoplasty.  We did explain that this  would put her at a slight increased risk for a cerebrovascular accident.  The patient understands and is willing to accept this risk as she is in  such severe pain.   All of her questions were answered.  Greater than 45 minutes was spent  on this patient evaluation.      Delton See, P.A.    ______________________________  Grandville Silos. Corliss Skains, M.D.    DR/MEDQ  D:  09/10/2008  T:  09/11/2008  Job:  [id number]   cc:   Lyn Records, M.D.  [facility]  [facility]. Marcelle Overlie, M.D.

## 2010-12-18 NOTE — Discharge Summary (Signed)
[name]                             ACCOUNT NO.:  [id number]   MEDICAL RECORD NO.:  [id number]                   PATIENT TYPE:  INP   LOCATION:  5703                                 FACILITY:  [facility]   PHYSICIAN:  Sherin Quarry, MD                   DATE OF BIRTH:  Feb 25, 1933   DATE OF ADMISSION:  10/17/2003  DATE OF DISCHARGE:  10/21/2003                                 DISCHARGE SUMMARY   [name] is a 77 year old lady who is a patient of Dr. Purnell Shoemaker who  presented to [facility] Emergency Room on March 17 with a one-week history  of nausea, vomiting, and diarrhea.  Prior to admission she felt very weak  and had very little appetite.  She presented to Dr. Amil Amen' office for  evaluation of her prothrombin time and was noted to be relatively  hypotension.  She was therefore sent to the emergency room.  In the  emergency room the patient was started on IV fluids and was seen by Dr.  Virginia Rochester.   PHYSICAL EXAMINATION:  VITAL SIGNS:  Blood pressure 92/58; heart rate 74;  temperature 98; respirations were 16; O2 saturation 94% on room air.  HEENT:  Within normal limits.  CHEST:  Clear to auscultation and percussion.  CARDIOVASCULAR:  Normal S1 and S2 without rubs or gallops.  There is a grade  2/6 systolic murmur that was audible.  ABDOMEN:  Soft, nontender, nondistended.  There were good bowel sounds.  There was no guarding or rebound.  NEUROLOGIC:  Within normal limits.  EXTREMITIES:  1+ pretibial edema.   LABORATORY TESTING:  The patient had three stools sent for C. difficile  assay.  There were all negative.  Stool white cells were negative.  Stools  for enteric pathogens are negative to date.  Magnesium level was 1.9.  The  INR on admission was 5.1.  For this reason, the patient's Coumadin dosage  was held.  By March 21, the INR was down to 2.3.  By March 21, it was my  impression that the diarrhea was doing substantially better.  The nurses  kept track of her  bowel function and indicated that she had no loose stools  over the last 24 hours.  I discussed with the patient the concept of perhaps  having a colonoscopy done.  She would prefer that this be considered as an  outpatient.  She has seen Dr. Sherin Quarry in the past.  I suggested that we  could make an appointment for her to see Dr. Sherin Quarry but she said she wants  to think about this.  Therefore, on March 21 the patient was discharged.   DISCHARGE DIAGNOSES:  1. Diarrhea, possibly secondary to viral illness.  2. Dehydration with acute renal dysfunction, resolved.  The creatinine at     time of discharge was 1.3.  3. History of congestive heart  failure, status post pacemaker placement.  4. History of magnesium deficiency.   DISCHARGE MEDICATIONS:  The patient will continue her usual medicines which  consist of Allegra 60 mg daily; Lanoxin 0.125 mg daily; benazepril 20 mg  daily; Lasix 40 mg daily; metoprolol 50 mg b.i.d.; Coumadin which the  patient takes 3 mg on Monday, Wednesday, and Friday, 1.5 mg on other day;  amiloride 5 mg daily; magnesium tablets.   She will follow up with Dr. Purnell Shoemaker and Dr. Amil Amen.  As I mentioned, if the  patient decides to proceed, it would be an easy thing to do to arrange a  follow-up appointment with Dr. Sherin Quarry for a repeat colon examination.  Dr.  Lucretia Kern number is [phone].                                                Sherin Quarry, MD    SY/MEDQ  D:  10/21/2003  T:  10/22/2003  Job:  [id number]   cc:   Francisca December, M.D.  [address]  [address]  [place]  [place] [address]  Fax: [phone]   Lianne Bushy, M.D.  [address]  [place]  [place] [address]  Fax: [phone]

## 2010-12-18 NOTE — H&P (Signed)
NAMECYNTHEA, ZACHMAN                             ACCOUNT NO.:  [id number]   MEDICAL RECORD NO.:  [id number]                   PATIENT TYPE:  EMS   LOCATION:  MAJO                                 FACILITY:  [facility]   PHYSICIAN:  Hollice Espy, M.D.            DATE OF BIRTH:  08/20/1932   DATE OF ADMISSION:  10/17/2003  DATE OF DISCHARGE:                                HISTORY & PHYSICAL   PATIENT CARDIOLOGIST:  Dr. Marlowe Aschoff.   CHIEF COMPLAINT:  Weakness and diarrhea.   HISTORY:  This is a 77 year old white female with a past medical history of  congestive heart failure, who presents with a one-week history of nausea,  vomiting and diarrhea.  She tells me that she has had family at home, who  have been ill lately and she feels like she caught this bug.  She started  having diarrhea, nausea and vomiting starting a week ago Thursday.  She  continued to have problems with the diarrhea, but the diarrhea improved by  Sunday.  The nausea and vomiting improved as well.  She has done relatively  well, but she was feeling very weak, and she tried to drink some milk  yesterday and started having diarrhea again.  She has overall had decreased  p.o. over the past week.  The patient was seen by her cardiologist today for  checkup of her weekly PT/INR and at that time it was noted that she was  hypotensive, and felt to be clinically dehydrated.  She was sent to the  emergency room for evaluation of dehydration.  The patient was started on IV  fluids.  She already states that she is feeling a little bit better.  She  currently denies any vomiting, although she complains of some mild nausea.  She denies any fevers or chills.  She denies any abdominal pain.  She denies  any hematuria, dysuria, constipation, diarrhea.  She denies any chest pain,  pressure,  palpitations, shortness of breath, wheezing or cough.  She denies  any extremity focal weakness, but she does complain of being fatigued  overall.  Lab work was sent on this patient, and she was found to have a  normal white count with no shift, but she was noted to have a BUN and  creatinine of 67 and 2.7.   PAST MEDICAL HISTORY:  Severe congestive heart failure, placement of a  pacemaker, magnesium deficiency and seasonal  rhinitis.   MEDICATIONS:  The patient is on Allegro 60 p.o. daily, digoxin 0.125 mg p.o.  daily, Benazepril HCL 20 mg p.o. daily, Lasix 40 mg p.o. daily, Metoprolol  50 mg p.o. b.i.d., Coumadin 3 mg alternating with 1.5 mg, three mg on  Monday, Wednesday and Friday, 1.5 mg on Tuesday, Thursday, Saturday and  Sunday.  Amiloride HCL 5 mg p.o. daily, mag tab SR 84 mg five pills in the  morning, four pills at  lunch, five pills at night and Loperamide.   ALLERGIES:  She has no known drug allergies.   SOCIAL HISTORY:  She denies any tobacco, alcohol or drug use.   FAMILY HISTORY:  Noncontributory.   PHYSICAL EXAMINATION:  VITAL SIGNS ON ADMISSION:  Blood pressure 92/58,  heart rate 74, temperature 96, respirations 16.  O2 SAT 94% on room air.  She was sating with a systolic in the 70s, which was at Dr. Imagene Riches office.  GENERAL:  She appears to be alert and oriented x 3 in no apparent distress.  HEENT:  Is normocephalic atraumatic.  Her mucous membranes are dry.  No  carotid bruits.  HEART:  Regular rate and rhythm.  2 out of 6 systolic ejection murmur.  LUNGS:  Decreased breath sounds throughout.  ABDOMEN:  Is soft and nontender, nondistended, positive bowel sounds.  EXTREMITIES:  She has 1+ pitting edema bilaterally.  1+ pulses.   LAB WORK:  White count 6.6.  H and H 14.0 and 14.1.  MCV of 89.  Platelet  count 174.  Neutrophil count of 61%.  Sodium 138, potassium 4.0, chloride  103, bicarb 24.  BUN 67, creatinine 2.7, glucose 105.  LFTs are all within  normal limits as is her calcium is 8.4.  A UA is drawn and is pending.   ASSESSMENT AND PLAN:  This is a 77 year old white female with dehydration,   suspect diarrhea secondary to viral gastroenteritis.  She has no white  count, temperature or shift.  Will gently rehydrate given her history of  CHF.  Recheck a B-__________ in the morning.  I am also putting her Lasix on  hold, but will continue her beta blocker, dig, ACE and Amiloride.                                                Hollice Espy, M.D.    SKK/MEDQ  D:  10/17/2003  T:  10/20/2003  Job:  [id number]

## 2010-12-18 NOTE — Op Note (Signed)
[name], [name]                             ACCOUNT NO.:  [id number]   MEDICAL RECORD NO.:  [id number]                   PATIENT TYPE:  OIB   LOCATION:  2899                                 FACILITY:  [facility]   PHYSICIAN:  [name], M.D.               DATE OF BIRTH:  [birth year]   DATE OF PROCEDURE:  09/11/2002  DATE OF DISCHARGE:                                 OPERATIVE REPORT   PROCEDURES PERFORMED:  1. Temporary transvenous pacemaker.  2. Explant old pacing generator.  3. Insert and replace new pacing generator single chamber.   INDICATIONS FOR PROCEDURE:  The patient is a 77 year old woman who has  reached elective replacement interval on her current [employer] Trilogy DR.  She has undergone an interval AV nodal ablation for chronic atrial  fibrillation and difficult to control ventricular response.  She is going to  have her dual chamber device replaced with a single chamber.   DESCRIPTION OF PROCEDURE:  The patient was brought to the cardiac  catheterization laboratory in the post absorptive state.  The left  prepectoral region was prepped and draped in the usual sterile fashion.  Local anesthesia was obtained with infiltration of 1% lidocaine with  epinephrine throughout the left prepectoral region.  A 6 to 7 cm incision  was made on top of and parallel to the previous surgical incision.  This was  carried down by sharp dissection to the pacemaker capsule.  The floor of the  capsule was incised.  However, a 6 French catheter sheath was inserted  percutaneously into the left subclavian vein utilizing an 18 gauge thin-  walled needle through which was passed a 0.038 type J-guide wire.  Through  this 6 French sheath, a 5 French balloon flow directed temporary pacing wire  was advanced into the right ventricle.  The leads were connected to a  temporary pacing generator and adequate catheter was confirmed at 2 ma  output and a sensitivity of 2 mV.  The rate was turned  down to 50 beats per  minute.  The pacemaker capsule was then incised and the pacing generator was  then delivered.  The atrial lead was detached from the pacing generator and  capped using a 0 silk ligature and the silicone cap.  The ventricular lead  was detached from the pacing generator and it was tested for adequate  capture as noted below.  Sensitivity was not determined as the patient was  pacemaker dependent and asystolic with the device turned to 30 beats per  minute.  The ventricular lead was then attached to the new pacing generator.  The pocket was copiously irrigated using 1% kanamycin solution.  The  pacemaker was then placed within the pocket and the leads wound beneath it.  The temporary pacing wire was removed.  After inspecting the pocket for  adequate hemostasis, the pocket was closed using 2-0  Dexon in a running  fashion for the subcutaneous layer.  The skin was approximated using 5-0  Dexon in a running subcuticular fashion.  Steri-Strips and a sterile  dressing were applied.  The patient was transported to the recovery area in  stable condition.  Pacemaker mode was DVIR at 60 beats per minute.    EQUIPMENT DATA:  The explanted generator was a [employer] Trilogy  DR model number 2360, serial number [id number].  The implanted generator is a  Engineer, water. [employer], serial number [id number].  The atrial lead  had a serial number of [id number] and the ventricular lead which was a St. Jude  model number 1246T, serial number [id number].  It had a pacing threshold of  1.1V at 0.5 ms.  The impedance was 710 ohms at 5 V output.                                                [name], M.D.    JHE/MEDQ  D:  09/11/2002  T:  09/11/2002  Job:  [id number]   cc:   Lyn Records III, M.D.  [address]  [address]  [place]  [place] [address]  Fax: [phone]   Lianne Bushy, M.D.  [address]  [place]  [place] [address]  Fax: [phone]

## 2011-01-11 ENCOUNTER — Ambulatory Visit: Payer: Medicare Other | Attending: Family Medicine | Admitting: Physical Therapy

## 2011-01-11 DIAGNOSIS — G20A1 Parkinson's disease without dyskinesia, without mention of fluctuations: Secondary | ICD-10-CM | POA: Insufficient documentation

## 2011-01-11 DIAGNOSIS — R269 Unspecified abnormalities of gait and mobility: Secondary | ICD-10-CM | POA: Insufficient documentation

## 2011-01-11 DIAGNOSIS — M6281 Muscle weakness (generalized): Secondary | ICD-10-CM | POA: Insufficient documentation

## 2011-01-11 DIAGNOSIS — IMO0001 Reserved for inherently not codable concepts without codable children: Secondary | ICD-10-CM | POA: Insufficient documentation

## 2011-01-11 DIAGNOSIS — G20 Parkinson's disease: Secondary | ICD-10-CM | POA: Insufficient documentation

## 2011-01-19 ENCOUNTER — Ambulatory Visit: Payer: Medicare Other | Admitting: Physical Therapy

## 2011-01-20 ENCOUNTER — Ambulatory Visit: Payer: Medicare Other | Admitting: Physical Therapy

## 2011-01-25 ENCOUNTER — Ambulatory Visit: Payer: Medicare Other | Admitting: Rehabilitative and Restorative Service Providers"

## 2011-01-28 ENCOUNTER — Ambulatory Visit: Payer: Medicare Other | Admitting: Physical Therapy

## 2011-02-01 ENCOUNTER — Ambulatory Visit: Payer: Medicare Other | Attending: Family Medicine | Admitting: Physical Therapy

## 2011-02-01 DIAGNOSIS — G20A1 Parkinson's disease without dyskinesia, without mention of fluctuations: Secondary | ICD-10-CM | POA: Insufficient documentation

## 2011-02-01 DIAGNOSIS — R269 Unspecified abnormalities of gait and mobility: Secondary | ICD-10-CM | POA: Insufficient documentation

## 2011-02-01 DIAGNOSIS — G20 Parkinson's disease: Secondary | ICD-10-CM | POA: Insufficient documentation

## 2011-02-01 DIAGNOSIS — M6281 Muscle weakness (generalized): Secondary | ICD-10-CM | POA: Insufficient documentation

## 2011-02-01 DIAGNOSIS — IMO0001 Reserved for inherently not codable concepts without codable children: Secondary | ICD-10-CM | POA: Insufficient documentation

## 2011-02-04 ENCOUNTER — Ambulatory Visit: Payer: Medicare Other | Admitting: Physical Therapy

## 2011-02-08 ENCOUNTER — Ambulatory Visit: Payer: Medicare Other | Admitting: Physical Therapy

## 2011-02-11 ENCOUNTER — Ambulatory Visit: Payer: Medicare Other | Admitting: Physical Therapy

## 2011-02-15 ENCOUNTER — Ambulatory Visit: Payer: Medicare Other | Admitting: Physical Therapy

## 2011-02-17 ENCOUNTER — Ambulatory Visit: Payer: Medicare Other | Admitting: Physical Therapy

## 2011-02-22 ENCOUNTER — Ambulatory Visit: Payer: Medicare Other | Admitting: Physical Therapy

## 2011-02-25 ENCOUNTER — Ambulatory Visit: Payer: Medicare Other | Admitting: Physical Therapy

## 2011-03-01 ENCOUNTER — Ambulatory Visit: Payer: Medicare Other | Admitting: Physical Therapy

## 2011-03-03 ENCOUNTER — Ambulatory Visit: Payer: Medicare Other | Attending: Neurology | Admitting: Physical Therapy

## 2011-03-03 DIAGNOSIS — R269 Unspecified abnormalities of gait and mobility: Secondary | ICD-10-CM | POA: Insufficient documentation

## 2011-03-03 DIAGNOSIS — G20A1 Parkinson's disease without dyskinesia, without mention of fluctuations: Secondary | ICD-10-CM | POA: Insufficient documentation

## 2011-03-03 DIAGNOSIS — G20 Parkinson's disease: Secondary | ICD-10-CM | POA: Insufficient documentation

## 2011-03-03 DIAGNOSIS — IMO0001 Reserved for inherently not codable concepts without codable children: Secondary | ICD-10-CM | POA: Insufficient documentation

## 2011-03-03 DIAGNOSIS — M6281 Muscle weakness (generalized): Secondary | ICD-10-CM | POA: Insufficient documentation

## 2011-03-04 ENCOUNTER — Ambulatory Visit: Payer: Medicare Other | Admitting: Physical Therapy

## 2011-03-08 ENCOUNTER — Ambulatory Visit: Payer: Medicare Other | Admitting: Physical Therapy

## 2011-03-10 ENCOUNTER — Ambulatory Visit: Payer: Medicare Other | Admitting: Physical Therapy

## 2011-07-15 ENCOUNTER — Other Ambulatory Visit: Payer: Self-pay | Admitting: Gastroenterology

## 2011-07-15 DIAGNOSIS — K862 Cyst of pancreas: Secondary | ICD-10-CM

## 2011-07-19 ENCOUNTER — Ambulatory Visit
Admission: RE | Admit: 2011-07-19 | Discharge: 2011-07-19 | Disposition: A | Discharge status: Home / Self Care | Payer: Medicare Other | Source: Ambulatory Visit | Attending: Gastroenterology | Admitting: Gastroenterology

## 2011-07-19 DIAGNOSIS — K862 Cyst of pancreas: Secondary | ICD-10-CM

## 2011-07-19 MED ORDER — IOHEXOL 350 MG/ML SOLN
100.0000 mL | Freq: Once | INTRAVENOUS | Status: AC | PRN
  Administered 2011-07-19: 100 mL via INTRAVENOUS

## 2011-08-31 ENCOUNTER — Inpatient Hospital Stay (HOSPITAL_COMMUNITY)
Admission: EM | Admit: 2011-08-31 | Discharge: 2011-09-02 | DRG: 684 | Disposition: A | Discharge status: Home / Self Care | Payer: Medicare Other | Attending: Internal Medicine | Admitting: Internal Medicine

## 2011-08-31 ENCOUNTER — Other Ambulatory Visit: Payer: Self-pay

## 2011-08-31 ENCOUNTER — Emergency Department (HOSPITAL_COMMUNITY): Payer: Medicare Other

## 2011-08-31 ENCOUNTER — Encounter (HOSPITAL_COMMUNITY): Payer: Self-pay | Admitting: *Deleted

## 2011-08-31 DIAGNOSIS — R627 Adult failure to thrive: Secondary | ICD-10-CM | POA: Diagnosis present

## 2011-08-31 DIAGNOSIS — I10 Essential (primary) hypertension: Secondary | ICD-10-CM | POA: Diagnosis present

## 2011-08-31 DIAGNOSIS — Z79899 Other long term (current) drug therapy: Secondary | ICD-10-CM

## 2011-08-31 DIAGNOSIS — IMO0002 Reserved for concepts with insufficient information to code with codable children: Secondary | ICD-10-CM

## 2011-08-31 DIAGNOSIS — Z7901 Long term (current) use of anticoagulants: Secondary | ICD-10-CM

## 2011-08-31 DIAGNOSIS — M109 Gout, unspecified: Secondary | ICD-10-CM | POA: Diagnosis present

## 2011-08-31 DIAGNOSIS — G20 Parkinson's disease: Secondary | ICD-10-CM | POA: Diagnosis present

## 2011-08-31 DIAGNOSIS — R5383 Other fatigue: Secondary | ICD-10-CM

## 2011-08-31 DIAGNOSIS — R531 Weakness: Secondary | ICD-10-CM | POA: Diagnosis present

## 2011-08-31 DIAGNOSIS — R5381 Other malaise: Secondary | ICD-10-CM | POA: Diagnosis present

## 2011-08-31 DIAGNOSIS — N179 Acute kidney failure, unspecified: Principal | ICD-10-CM | POA: Diagnosis present

## 2011-08-31 DIAGNOSIS — D689 Coagulation defect, unspecified: Secondary | ICD-10-CM | POA: Diagnosis present

## 2011-08-31 DIAGNOSIS — I4891 Unspecified atrial fibrillation: Secondary | ICD-10-CM | POA: Diagnosis present

## 2011-08-31 DIAGNOSIS — G20A1 Parkinson's disease without dyskinesia, without mention of fluctuations: Secondary | ICD-10-CM | POA: Diagnosis present

## 2011-08-31 DIAGNOSIS — N289 Disorder of kidney and ureter, unspecified: Secondary | ICD-10-CM

## 2011-08-31 DIAGNOSIS — T45515A Adverse effect of anticoagulants, initial encounter: Secondary | ICD-10-CM | POA: Diagnosis present

## 2011-08-31 DIAGNOSIS — R791 Abnormal coagulation profile: Secondary | ICD-10-CM

## 2011-08-31 DIAGNOSIS — I2510 Atherosclerotic heart disease of native coronary artery without angina pectoris: Secondary | ICD-10-CM | POA: Diagnosis present

## 2011-08-31 DIAGNOSIS — M6281 Muscle weakness (generalized): Secondary | ICD-10-CM | POA: Diagnosis present

## 2011-08-31 DIAGNOSIS — Z950 Presence of cardiac pacemaker: Secondary | ICD-10-CM

## 2011-08-31 HISTORY — DX: Shortness of breath: R06.02

## 2011-08-31 HISTORY — DX: Cardiac arrhythmia, unspecified: I49.9

## 2011-08-31 HISTORY — DX: Essential (primary) hypertension: I10

## 2011-08-31 HISTORY — DX: Atherosclerotic heart disease of native coronary artery without angina pectoris: I25.10

## 2011-08-31 HISTORY — DX: Myoneural disorder, unspecified: G70.9

## 2011-08-31 HISTORY — DX: Unspecified osteoarthritis, unspecified site: M19.90

## 2011-08-31 HISTORY — DX: Cardiac murmur, unspecified: R01.1

## 2011-08-31 LAB — COMPREHENSIVE METABOLIC PANEL
ALT: 7 U/L (ref 0–35)
AST: 32 U/L (ref 0–37)
Albumin: 3.4 g/dL — ABNORMAL LOW (ref 3.5–5.2)
CO2: 27 mEq/L (ref 19–32)
Calcium: 9.4 mg/dL (ref 8.4–10.5)
Creatinine, Ser: 1.35 mg/dL — ABNORMAL HIGH (ref 0.50–1.10)
GFR calc non Af Amer: 37 mL/min — ABNORMAL LOW (ref >90)
Sodium: 136 mEq/L (ref 135–145)
Total Protein: 6.5 g/dL (ref 6.0–8.3)

## 2011-08-31 LAB — CARDIAC PANEL(CRET KIN+CKTOT+MB+TROPI)
CK, MB: 3.2 ng/mL (ref 0.3–4.0)
Total CK: 69 U/L (ref 7–177)
Troponin I: <0.3 ng/mL (ref <0.30)

## 2011-08-31 LAB — URINE MICROSCOPIC-ADD ON

## 2011-08-31 LAB — URINALYSIS, ROUTINE W REFLEX MICROSCOPIC
Glucose, UA: NEGATIVE mg/dL
Ketones, ur: NEGATIVE mg/dL
Protein, ur: NEGATIVE mg/dL
Urobilinogen, UA: 0.2 mg/dL (ref 0.0–1.0)

## 2011-08-31 LAB — PROTIME-INR
INR: 6.31 (ref 0.00–1.49)
Prothrombin Time: 56.5 seconds — ABNORMAL HIGH (ref 11.6–15.2)

## 2011-08-31 LAB — DIFFERENTIAL
Basophils Relative: 0 % (ref 0–1)
Eosinophils Absolute: 0.3 10*3/uL (ref 0.0–0.7)
Lymphs Abs: 1.2 10*3/uL (ref 0.7–4.0)
Monocytes Relative: 9 % (ref 3–12)
Neutro Abs: 3.5 10*3/uL (ref 1.7–7.7)
Neutrophils Relative %: 64 % (ref 43–77)

## 2011-08-31 LAB — CBC
Hemoglobin: 11.3 g/dL — ABNORMAL LOW (ref 12.0–15.0)
MCHC: 33.3 g/dL (ref 30.0–36.0)
Platelets: 154 10*3/uL (ref 150–400)
RBC: 3.63 MIL/uL — ABNORMAL LOW (ref 3.87–5.11)

## 2011-08-31 MED ORDER — SODIUM CHLORIDE 0.9 % IV BOLUS (SEPSIS)
500.0000 mL | Freq: Once | INTRAVENOUS | Status: AC
  Administered 2011-08-31: 500 mL via INTRAVENOUS

## 2011-08-31 MED ORDER — SODIUM CHLORIDE 0.9 % IV SOLN
INTRAVENOUS | Status: DC
  Administered 2011-08-31: 22:00:00 via INTRAVENOUS

## 2011-08-31 NOTE — ED Notes (Signed)
EKG DONE BY EMT R Kinser Fellman- OLD AND NEW EKG GIVEN TO DR Rubin Payor

## 2011-08-31 NOTE — ED Notes (Signed)
Patient transported to CT and xray 

## 2011-08-31 NOTE — ED Provider Notes (Addendum)
History     CSN: [id number]  Arrival date & time 08/31/11  1752   First MD Initiated Contact with Patient 08/31/11 2008      Chief Complaint  Patient presents with  . Fall    (Consider location/radiation/quality/duration/timing/severity/associated sxs/prior treatment) Patient is a 78 y.o. female presenting with fall. The history is provided by the patient and a relative.  Fall Pertinent negatives include no numbness, no abdominal pain, no nausea, no vomiting and no headaches.   patient had a fall approximately a week ago. She lied on the floor for close to a day. Is unable to get up. She had been during that time. She did not go to the hospital at that time. Since then she's had decreased energy and some confusion. She's had generalized weakness and increased sleepiness. She saw her primary care doctor found a urinary tract infection. She's been on antibiotics for 3 days, she states that it had to be changed due to cost. She also had an INR that was elevated. She states it was up to 7. She may have had or had a little bit at that time. She also has some pain in her right elbow. She's still been eating less.  Past Medical History  Diagnosis Date  . Coronary artery disease     Past Surgical History  Procedure Date  . Pacemaker placement     No family history on file.  History  Substance Use Topics  . Smoking status: Never Smoker   . Smokeless tobacco: Not on file  . Alcohol Use: No    OB History    Grav Para Term Preterm Abortions TAB SAB Ect Mult Living                  Review of Systems  Constitutional: Positive for activity change, appetite change and fatigue.  HENT: Negative for neck stiffness.   Eyes: Negative for pain.  Respiratory: Negative for chest tightness and shortness of breath.   Cardiovascular: Negative for chest pain and leg swelling.  Gastrointestinal: Negative for nausea, vomiting, abdominal pain and diarrhea.  Genitourinary: Negative for flank  pain.  Musculoskeletal: Negative for back pain.       Right elbow pain  Skin: Negative for rash.  Neurological: Negative for weakness, numbness and headaches.  Psychiatric/Behavioral: Negative for behavioral problems.    Allergies  Review of patient's allergies indicates no known allergies.  Home Medications   Current Outpatient Rx  Name Route Sig Dispense Refill  . ACETAMINOPHEN 325 MG PO TABS Oral Take 162 mg by mouth daily.    . ALLOPURINOL 100 MG PO TABS Oral Take 100 mg by mouth daily.    . AMILORIDE HCL 5 MG PO TABS Oral Take 5 mg by mouth daily.    Marland Kitchen CARBIDOPA-LEVODOPA 25-100 MG PO TABS Oral Take 0.5 tablets by mouth 3 (three) times daily.    Marland Kitchen MAGNESIUM OXIDE 400 MG PO TABS Oral Take 1,200 mg by mouth 2 (two) times daily.    . WARFARIN SODIUM 3 MG PO TABS Oral Take 1.5-3 mg by mouth See admin instructions. Takes 1 tablet Sun, Mon, Wed, Thurs and Fri. Takes 0.5 tablet Tues and Sat.      BP 145/88  Pulse 75  Temp(Src) 98 F (36.7 C) (Oral)  Resp 16  SpO2 96%  Physical Exam  Nursing note and vitals reviewed. Constitutional: She is oriented to person, place, and time. She appears well-developed and well-nourished.  HENT:  Head: Normocephalic and atraumatic.  Eyes: EOM are normal. Pupils are equal, round, and reactive to light.  Neck: Normal range of motion. Neck supple.  Cardiovascular: Normal rate, regular rhythm and normal heart sounds.   No murmur heard. Pulmonary/Chest: Effort normal and breath sounds normal. No respiratory distress. She has no wheezes. She has no rales.  Abdominal: Soft. Bowel sounds are normal. She exhibits no distension. There is no tenderness. There is no rebound and no guarding.  Musculoskeletal: Normal range of motion.       Tenderness to right elbow. Pain with movement. She states it is her gout  Neurological: She is alert and oriented to person, place, and time. No cranial nerve deficit.       Mild confusion.  Skin: Skin is warm and dry.    Psychiatric: She has a normal mood and affect. Her speech is normal.    ED Course  Procedures (including critical care time)  Labs Reviewed  CBC - Abnormal; Notable for the following:    RBC 3.63 (*)    Hemoglobin 11.3 (*)    HCT 33.9 (*)    All other components within normal limits  URINALYSIS, ROUTINE W REFLEX MICROSCOPIC - Abnormal; Notable for the following:    Hgb urine dipstick TRACE (*)    All other components within normal limits  PROTIME-INR - Abnormal; Notable for the following:    Prothrombin Time 56.5 (*)    INR 6.31 (*)    All other components within normal limits  COMPREHENSIVE METABOLIC PANEL - Abnormal; Notable for the following:    BUN 38 (*)    Creatinine, Ser 1.35 (*)    Albumin 3.4 (*)    GFR calc non Af Amer 37 (*)    GFR calc Af Amer 43 (*)    All other components within normal limits  URINE MICROSCOPIC-ADD ON - Abnormal; Notable for the following:    Squamous Epithelial / LPF FEW (*)    All other components within normal limits  DIFFERENTIAL  CARDIAC PANEL(CRET KIN+CKTOT+MB+TROPI)  URINE CULTURE   Dg Chest 2 View  08/31/2011  *RADIOLOGY REPORT*  Clinical Data: Altered mental status.  CHEST - 2 VIEW  Comparison: 10/15/2010  Findings: Two views of the chest demonstrate enlargement of the cardiac silhouette.  Few densities in the left mid lung are suggestive for atelectasis.  No evidence for edema or pneumothorax. Patient has undergone vertebral augmentation procedures in the upper lumbar spine.  There is a left-sided dual lead cardiac pacemaker.  No evidence of pleural effusions.  IMPRESSION: Cardiomegaly.  Scarring or atelectasis in the left mid lung.  Original Report Authenticated By: Richarda Overlie, M.D.   Ct Head Wo Contrast  08/31/2011  *RADIOLOGY REPORT*  Clinical Data: The patient fell about 1 week ago.  Weakness and sleepiness since the fall.  CT HEAD WITHOUT CONTRAST  Technique:  Contiguous axial images were obtained from the base of the skull through  the vertex without contrast.  Comparison: 10/15/2010  Findings: Mild cerebral atrophy.  Ventricular dilatation consistent with central atrophy.  Low attenuation change in the deep white matter consistent with small vessel ischemic change.  No mass effect or midline shift.  No abnormal extra-axial fluid collections.  Gray-white matter junctions are distinct.  Basal cisterns are not effaced.  No acute intracranial hemorrhage. Visualized paranasal sinuses are not opacified.  No depressed skull fractures.  Vascular calcifications.  Stable appearance since previous study.  IMPRESSION: No evidence of acute intracranial hemorrhage, mass lesion, or acute infarct.  Chronic atrophy  and small vessel ischemic changes again demonstrated.  Original Report Authenticated By: Marlon Pel, M.D.     1. Fatigue   2. Renal insufficiency   3. Elevated INR      Date: 08/31/2011  Rate: 75  Rhythm: ventricular pacing  QRS Axis: normal  Intervals: normal  ST/T Wave abnormalities: normal  Conduction Disutrbances:none  Narrative Interpretation:   Old EKG Reviewed: unchanged    MDM  Fall a few days ago with general failure to thrive since. She had a positive urinary tract infection by culture. The urinalysis looks good here. Original insufficiency appears close to baseline. Family states she's been able to manage alone right now. Head CT is reassuring. Her INR is elevated. Family feels little unsafe to go home right now. She'll be admitted to the hospital        [facility] R. Rubin Payor, MD 08/31/11 2349  Juliet Rude. Rubin Payor, MD 08/31/11 2350

## 2011-08-31 NOTE — ED Notes (Signed)
She had a fall approx one week ago..  She saw her doctor today and she was sent here because she has had weakness and sleepy since the fall

## 2011-09-01 ENCOUNTER — Encounter (HOSPITAL_COMMUNITY): Payer: Self-pay | Admitting: Internal Medicine

## 2011-09-01 DIAGNOSIS — R531 Weakness: Secondary | ICD-10-CM | POA: Diagnosis present

## 2011-09-01 DIAGNOSIS — I4891 Unspecified atrial fibrillation: Secondary | ICD-10-CM | POA: Diagnosis present

## 2011-09-01 DIAGNOSIS — I10 Essential (primary) hypertension: Secondary | ICD-10-CM | POA: Diagnosis present

## 2011-09-01 LAB — COMPREHENSIVE METABOLIC PANEL
AST: 29 U/L (ref 0–37)
Albumin: 2.9 g/dL — ABNORMAL LOW (ref 3.5–5.2)
BUN: 39 mg/dL — ABNORMAL HIGH (ref 6–23)
Calcium: 9 mg/dL (ref 8.4–10.5)
Creatinine, Ser: 1.31 mg/dL — ABNORMAL HIGH (ref 0.50–1.10)
GFR calc non Af Amer: 38 mL/min — ABNORMAL LOW (ref >90)

## 2011-09-01 LAB — CBC
Hemoglobin: 10.3 g/dL — ABNORMAL LOW (ref 12.0–15.0)
MCH: 30.8 pg (ref 26.0–34.0)
MCV: 93.1 fL (ref 78.0–100.0)
RBC: 3.34 MIL/uL — ABNORMAL LOW (ref 3.87–5.11)
WBC: 4.7 10*3/uL (ref 4.0–10.5)

## 2011-09-01 LAB — CARDIAC PANEL(CRET KIN+CKTOT+MB+TROPI)
Relative Index: INVALID (ref 0.0–2.5)
Relative Index: INVALID (ref 0.0–2.5)
Total CK: 53 U/L (ref 7–177)
Total CK: 41 U/L (ref 7–177)
Troponin I: <0.3 ng/mL (ref <0.30)
Troponin I: <0.3 ng/mL (ref <0.30)

## 2011-09-01 LAB — URINE CULTURE
Colony Count: NO GROWTH
Culture: NO GROWTH

## 2011-09-01 LAB — TSH: TSH: 0.53 u[IU]/mL (ref 0.350–4.500)

## 2011-09-01 LAB — FERRITIN: Ferritin: 266 ng/mL (ref 10–291)

## 2011-09-01 LAB — PROTIME-INR
INR: 6.79 (ref 0.00–1.49)
Prothrombin Time: 59.9 seconds — ABNORMAL HIGH (ref 11.6–15.2)

## 2011-09-01 LAB — RETICULOCYTES
RBC.: 3.34 MIL/uL — ABNORMAL LOW (ref 3.87–5.11)
Retic Count, Absolute: 43.4 10*3/uL (ref 19.0–186.0)
Retic Ct Pct: 1.3 % (ref 0.4–3.1)

## 2011-09-01 MED ORDER — ACETAMINOPHEN 325 MG PO TABS: 650.0000 mg | ORAL_TABLET | Freq: Four times a day (QID) | ORAL | Status: DC | PRN

## 2011-09-01 MED ORDER — SODIUM CHLORIDE 0.9 % IV SOLN
INTRAVENOUS | Status: DC
  Administered 2011-09-01 – 2011-09-02 (×2): via INTRAVENOUS

## 2011-09-01 MED ORDER — ACETAMINOPHEN 650 MG RE SUPP: 650.0000 mg | Freq: Four times a day (QID) | RECTAL | Status: DC | PRN

## 2011-09-01 MED ORDER — ONDANSETRON HCL 4 MG PO TABS: 4.0000 mg | ORAL_TABLET | Freq: Four times a day (QID) | ORAL | Status: DC | PRN

## 2011-09-01 MED ORDER — ONDANSETRON HCL 4 MG/2ML IJ SOLN: 4.0000 mg | Freq: Four times a day (QID) | INTRAMUSCULAR | Status: DC | PRN

## 2011-09-01 MED ORDER — PREDNISONE 20 MG PO TABS
40.0000 mg | ORAL_TABLET | Freq: Every day | ORAL | Status: DC
  Administered 2011-09-01 – 2011-09-02 (×2): 40 mg via ORAL
  Filled 2011-09-01 (×3): qty 2

## 2011-09-01 MED ORDER — PHYTONADIONE 5 MG PO TABS
2.5000 mg | ORAL_TABLET | Freq: Once | ORAL | Status: AC
  Administered 2011-09-01: 2.5 mg via ORAL
  Filled 2011-09-01: qty 1

## 2011-09-01 MED ORDER — CARBIDOPA-LEVODOPA 25-100 MG PO TABS
0.5000 | ORAL_TABLET | Freq: Three times a day (TID) | ORAL | Status: DC
  Administered 2011-09-01 – 2011-09-02 (×4): 0.5 via ORAL
  Administered 2011-09-02: 17:00:00 via ORAL
  Filled 2011-09-01 (×7): qty 0.5

## 2011-09-01 MED ORDER — ALLOPURINOL 100 MG PO TABS
100.0000 mg | ORAL_TABLET | Freq: Every day | ORAL | Status: DC
  Administered 2011-09-01 – 2011-09-02 (×2): 100 mg via ORAL
  Filled 2011-09-01 (×3): qty 1

## 2011-09-01 NOTE — Progress Notes (Signed)
PT Cancellation Note  Treatment cancelled today due to medical issues with patient which prohibited therapy. Pt has INR of 6.9.  Catrina Fellenz 09/01/2011, 10:05 AM Theron Arista L. Jashad Depaula DPT [phone]

## 2011-09-01 NOTE — ED Notes (Addendum)
MD notified of INR and PT/PTT

## 2011-09-01 NOTE — Progress Notes (Signed)
ANTICOAGULATION CONSULT NOTE - Initial Consult  Pharmacy Consult for Coumadin Indication: atrial fibrillation  No Known Allergies   Vital Signs: Temp: 97.9 F (36.6 C) (01/30 0510) Temp src: Oral (01/30 0510) BP: 135/77 mmHg (01/30 0510) Pulse Rate: 78  (01/30 0510)  Labs:  Basename 09/01/11 0309 08/31/11 2039  HGB 10.3* 11.3*  HCT 31.1* 33.9*  PLT 139* 154  APTT -- --  LABPROT 59.9* 56.5*  INR 6.79* 6.31*  HEPARINUNFRC -- --  CREATININE 1.31* 1.35*  CKTOTAL 53 69  CKMB 2.8 3.2  TROPONINI <0.30 <0.30   CrCl is unknown because there is no height on file for the current visit.  Medical History: Past Medical History  Diagnosis Date  . Coronary artery disease   . Dysrhythmia   . Hypertension     Medications:  Prescriptions prior to admission  Medication Sig Dispense Refill  . acetaminophen (TYLENOL) 325 MG tablet Take 162 mg by mouth daily.      Marland Kitchen allopurinol (ZYLOPRIM) 100 MG tablet Take 100 mg by mouth daily.      Marland Kitchen aMILoride (MIDAMOR) 5 MG tablet Take 5 mg by mouth daily.      . carbidopa-levodopa (SINEMET) 25-100 MG per tablet Take 0.5 tablets by mouth 3 (three) times daily.      . magnesium oxide (MAG-OX) 400 MG tablet Take 1,200 mg by mouth 2 (two) times daily.      Marland Kitchen warfarin (COUMADIN) 3 MG tablet Take 1.5-3 mg by mouth See admin instructions. Takes 1 tablet Sun, Mon, Wed, Thurs and Fri. Takes 0.5 tablet Tues and Sat.        Assessment: 78 yo female presents for weakness. [name]. Admit INR 6.31.  Last dose taken on 1/28, no dose given  INR = 6.7 about 7 hours later.  No bleeding reported per RN's assessment this AM.  Pt seemed a bit confused about coumadin dose but thinks she was taking correctly as listed above.  Goal of Therapy:  INR 2-3   Plan:  1. Hold coumadin today. 2. Daily INR. 3. Monitor s/s bleeding or if INR trending up past dose being held, could consider reversing with low dose oral vitamin Allayne Gitelman, Kristine Royal,  RPh  09/01/2011,7:47 AM

## 2011-09-01 NOTE — Progress Notes (Signed)
Subjective: Patient seen and examined this am. Admission H&P reviewed  Objective:  Vital signs in last 24 hours:  Filed Vitals:   08/31/11 2208 09/01/11 0055 09/01/11 0510 09/01/11 0900  BP: 145/88 118/71 135/77 130/77  Pulse: 75 75 78 74  Temp: 98 F (36.7 C)  97.9 F (36.6 C) 98.1 F (36.7 C)  TempSrc: Oral  Oral Oral  Resp: 16 20 18 18   SpO2: 96% 92% 92% 94%    Intake/Output from previous day:   Intake/Output Summary (Last 24 hours) at 09/01/11 1229 Last data filed at 09/01/11 0900  Gross per 24 hour  Intake 503.75 ml  Output      0 ml  Net 503.75 ml    Physical Exam:  General: elderly female in no acute distress. HEENT: no pallor, no icterus, moist oral mucosa, no JVD, no lymphadenopathy Heart:  s1 &s2 irregular, without murmurs, rubs, gallops. Lungs: Clear to auscultation bilaterally. Abdomen: Soft, nontender, nondistended, positive bowel sounds. Extremities: tender to palpation over Rt elbow with limited ROM, No clubbing cyanosis or edema with positive pedal pulses. Neuro: Alert, awake, oriented x3, nonfocal.   Lab Results:  Basic Metabolic Panel:    Component Value Date/Time   NA 137 09/01/2011 0309   K 4.1 09/01/2011 0309   CL 103 09/01/2011 0309   CO2 26 09/01/2011 0309   BUN 39* 09/01/2011 0309   CREATININE 1.31* 09/01/2011 0309   GLUCOSE 101* 09/01/2011 0309   CALCIUM 9.0 09/01/2011 0309   CBC:    Component Value Date/Time   WBC 4.7 09/01/2011 0309   HGB 10.3* 09/01/2011 0309   HCT 31.1* 09/01/2011 0309   PLT 139* 09/01/2011 0309   MCV 93.1 09/01/2011 0309   NEUTROABS 3.5 08/31/2011 2039   LYMPHSABS 1.2 08/31/2011 2039   MONOABS 0.5 08/31/2011 2039   EOSABS 0.3 08/31/2011 2039   BASOSABS 0.0 08/31/2011 2039    No results found for this or any previous visit (from the past 240 hour(s)).  Studies/Results: Dg Chest 2 View  08/31/2011  *RADIOLOGY REPORT*  Clinical Data: Altered mental status.  CHEST - 2 VIEW  Comparison: 10/15/2010  Findings: Two views  of the chest demonstrate enlargement of the cardiac silhouette.  Few densities in the left mid lung are suggestive for atelectasis.  No evidence for edema or pneumothorax. Patient has undergone vertebral augmentation procedures in the upper lumbar spine.  There is a left-sided dual lead cardiac pacemaker.  No evidence of pleural effusions.  IMPRESSION: Cardiomegaly.  Scarring or atelectasis in the left mid lung.  Original Report Authenticated By: Richarda Overlie, M.D.   Dg Elbow Complete Right  09/01/2011  *RADIOLOGY REPORT*  Clinical Data: Right elbow pain, no recent injury, unable to fully extend  RIGHT ELBOW - COMPLETE 3+ VIEW  Comparison: None  Findings: Osseous demineralization. Joint spaces grossly preserved. Scattered small vessel vascular calcifications. Probable elbow joint effusion. Dorsal soft tissue swelling. No acute fracture, dislocation or bone destruction.  IMPRESSION: Osseous demineralization. Nonspecific elbow joint effusion. No definite acute bony abnormalities identified.  Original Report Authenticated By: Lollie Marrow, M.D.   Ct Head Wo Contrast  08/31/2011  *RADIOLOGY REPORT*  Clinical Data: The patient fell about 1 week ago.  Weakness and sleepiness since the fall.  CT HEAD WITHOUT CONTRAST  Technique:  Contiguous axial images were obtained from the base of the skull through the vertex without contrast.  Comparison: 10/15/2010  Findings: Mild cerebral atrophy.  Ventricular dilatation consistent with central atrophy.  Low attenuation  change in the deep white matter consistent with small vessel ischemic change.  No mass effect or midline shift.  No abnormal extra-axial fluid collections.  Gray-white matter junctions are distinct.  Basal cisterns are not effaced.  No acute intracranial hemorrhage. Visualized paranasal sinuses are not opacified.  No depressed skull fractures.  Vascular calcifications.  Stable appearance since previous study.  IMPRESSION: No evidence of acute intracranial  hemorrhage, mass lesion, or acute infarct.  Chronic atrophy and small vessel ischemic changes again demonstrated.  Original Report Authenticated By: Marlon Pel, M.D.    Medications: Scheduled Meds:   . allopurinol  100 mg Oral Daily  . carbidopa-levodopa  0.5 tablet Oral TID  . phytonadione  2.5 mg Oral Once  . sodium chloride  500 mL Intravenous Once   Continuous Infusions:   . sodium chloride 75 mL/hr at 09/01/11 0329  . DISCONTD: sodium chloride 125 mL/hr at 08/31/11 2208   PRN Meds:.acetaminophen, acetaminophen, ondansetron (ZOFRAN) IV, ondansetron  Assessment 78 year-old female with history of atrial fibrillation on on Coumadin, hypertension, gout and Parkinson's disease was found on the floor 4 days back and ? confused. She was brought in by family for persistent weakness.  Plan:  1. Generalized weakness  Seems to be due to deconditioning. On gentle IV hydration. Holding lasix  PT eval Head CT on admission with chronic ischemic changes TSH, vitamin B12 wnl  #2. Right elbow pain informs to be chronic. Unable to fully extent and mildly tender on exam. X ray shows no fracture but probable effusion. uric acid mildly elevated On chronic allopurinol  will give her a trial of po prednisone for 5 days Will get ortho eval for tap if symptoms not improved   #3. Coagulopathy on Coumadin  INR of 6.3 today. Holding coumadin. No signs of bleeding  will give 2.5 mg vitamin K  #4. Atrial fibrillation  status post pacemaker placement and on coumadin-  Rate on controlled  #5. Anemia  H&h stable, monitor  #6. History of Parkinson's disease  Cont sinemet  7. AKI  mild , monitor following IV hydration         LOS: 1 day   Mychael Smock 09/01/2011, 12:29 PM

## 2011-09-01 NOTE — H&P (Signed)
[name] is an 78 y.o. female.   PCP - Dr.Hamrick  In [place] History obtained from patient's daughter. Chief Complaint: Weakness. HPI: 78 year-old female with history of atrial fibrillation status post placement of placement on Coumadin, hypertension, gout and Parkinson's disease was found on the flow 4 days ago at patient's residence. Patient lives alone. Patient was found on the floor and confused but refused to go to the ER so the family took her to PCP next day that was Friday last. Her PCP felt that patient may have had a UTI and was started on antibiotics. Despite taking this patient was still feeling weak and not able to function like she used to and mainly she is not able to walk without help. Her PCP referred her to the hospital. In the ER patient CT head just not showing anything acute her lab works are not showing anything very remarkable. Patient will be admitted for further observation. Patient at this time is alert awake oriented to place person and time. Following commands. In addition patient has some pain in the right elbow. X-rays were negative for fracture. Patient denies any chest pain short breath nausea vomiting abdominal pain or diarrhea or dysuria.  Past Medical History  Diagnosis Date  . Coronary artery disease   . Dysrhythmia   . Hypertension     Past Surgical History  Procedure Date  . Pacemaker placement     Family History  Problem Relation Age of Onset  . Stroke Sister   . Parkinsonism Brother    Social History:  reports that she has never smoked. She does not have any smokeless tobacco history on file. She reports that she does not drink alcohol. Her drug history not on file.  Allergies: No Known Allergies  Medications Prior to Admission  Medication Dose Route Frequency Provider Last Rate Last Dose  . 0.9 %  sodium chloride infusion   Intravenous Continuous Juliet Rude. Rubin Payor, MD 125 mL/hr at 08/31/11 2208    . sodium chloride 0.9 % bolus 500 mL  500  mL Intravenous Once Harrold Donath R. Pickering, MD   500 mL at 08/31/11 2120   No current outpatient prescriptions on file as of 08/31/2011.    Results for orders placed during the hospital encounter of 08/31/11 (from the past 48 hour(s))  CBC     Status: Abnormal   Collection Time   08/31/11  8:39 PM      Component Value Range Comment   WBC 5.5  4.0 - 10.5 (K/uL)    RBC 3.63 (*) 3.87 - 5.11 (MIL/uL)    Hemoglobin 11.3 (*) 12.0 - 15.0 (g/dL)    HCT 96.0 (*) 45.4 - 46.0 (%)    MCV 93.4  78.0 - 100.0 (fL)    MCH 31.1  26.0 - 34.0 (pg)    MCHC 33.3  30.0 - 36.0 (g/dL)    RDW 09.8  11.9 - 14.7 (%)    Platelets 154  150 - 400 (K/uL)   DIFFERENTIAL     Status: Normal   Collection Time   08/31/11  8:39 PM      Component Value Range Comment   Neutrophils Relative 64  43 - 77 (%)    Neutro Abs 3.5  1.7 - 7.7 (K/uL)    Lymphocytes Relative 22  12 - 46 (%)    Lymphs Abs 1.2  0.7 - 4.0 (K/uL)    Monocytes Relative 9  3 - 12 (%)    Monocytes Absolute  0.5  0.1 - 1.0 (K/uL)    Eosinophils Relative 5  0 - 5 (%)    Eosinophils Absolute 0.3  0.0 - 0.7 (K/uL)    Basophils Relative 0  0 - 1 (%)    Basophils Absolute 0.0  0.0 - 0.1 (K/uL)   CARDIAC PANEL(CRET KIN+CKTOT+MB+TROPI)     Status: Normal   Collection Time   08/31/11  8:39 PM      Component Value Range Comment   Total CK 69  7 - 177 (U/L)    CK, MB 3.2  0.3 - 4.0 (ng/mL)    Troponin I <0.30  <0.30 (ng/mL)    Relative Index RELATIVE INDEX IS INVALID  0.0 - 2.5    PROTIME-INR     Status: Abnormal   Collection Time   08/31/11  8:39 PM      Component Value Range Comment   Prothrombin Time 56.5 (*) 11.6 - 15.2 (seconds)    INR 6.31 (*) 0.00 - 1.49    COMPREHENSIVE METABOLIC PANEL     Status: Abnormal   Collection Time   08/31/11  8:39 PM      Component Value Range Comment   Sodium 136  135 - 145 (mEq/L)    Potassium 4.9  3.5 - 5.1 (mEq/L)    Chloride 99  96 - 112 (mEq/L)    CO2 27  19 - 32 (mEq/L)    Glucose, Bld 93  70 - 99 (mg/dL)    BUN  38 (*) 6 - 23 (mg/dL)    Creatinine, Ser 1.61 (*) 0.50 - 1.10 (mg/dL)    Calcium 9.4  8.4 - 10.5 (mg/dL)    Total Protein 6.5  6.0 - 8.3 (g/dL)    Albumin 3.4 (*) 3.5 - 5.2 (g/dL)    AST 32  0 - 37 (U/L)    ALT 7  0 - 35 (U/L)    Alkaline Phosphatase 85  39 - 117 (U/L)    Total Bilirubin 0.5  0.3 - 1.2 (mg/dL)    GFR calc non Af Amer 37 (*) >90 (mL/min)    GFR calc Af Amer 43 (*) >90 (mL/min)   URINALYSIS, ROUTINE W REFLEX MICROSCOPIC     Status: Abnormal   Collection Time   08/31/11 10:37 PM      Component Value Range Comment   Color, Urine YELLOW  YELLOW     APPearance CLEAR  CLEAR     Specific Gravity, Urine 1.015  1.005 - 1.030     pH 6.5  5.0 - 8.0     Glucose, UA NEGATIVE  NEGATIVE (mg/dL)    Hgb urine dipstick TRACE (*) NEGATIVE     Bilirubin Urine NEGATIVE  NEGATIVE     Ketones, ur NEGATIVE  NEGATIVE (mg/dL)    Protein, ur NEGATIVE  NEGATIVE (mg/dL)    Urobilinogen, UA 0.2  0.0 - 1.0 (mg/dL)    Nitrite NEGATIVE  NEGATIVE     Leukocytes, UA NEGATIVE  NEGATIVE    URINE MICROSCOPIC-ADD ON     Status: Abnormal   Collection Time   08/31/11 10:37 PM      Component Value Range Comment   Squamous Epithelial / LPF FEW (*) RARE     WBC, UA 0-2  <3 (WBC/hpf)    RBC / HPF 0-2  <3 (RBC/hpf)    Dg Chest 2 View  08/31/2011  *RADIOLOGY REPORT*  Clinical Data: Altered mental status.  CHEST - 2 VIEW  Comparison: 10/15/2010  Findings: Two  views of the chest demonstrate enlargement of the cardiac silhouette.  Few densities in the left mid lung are suggestive for atelectasis.  No evidence for edema or pneumothorax. Patient has undergone vertebral augmentation procedures in the upper lumbar spine.  There is a left-sided dual lead cardiac pacemaker.  No evidence of pleural effusions.  IMPRESSION: Cardiomegaly.  Scarring or atelectasis in the left mid lung.  Original Report Authenticated By: Richarda Overlie, M.D.   Dg Elbow Complete Right  09/01/2011  *RADIOLOGY REPORT*  Clinical Data: Right elbow  pain, no recent injury, unable to fully extend  RIGHT ELBOW - COMPLETE 3+ VIEW  Comparison: None  Findings: Osseous demineralization. Joint spaces grossly preserved. Scattered small vessel vascular calcifications. Probable elbow joint effusion. Dorsal soft tissue swelling. No acute fracture, dislocation or bone destruction.  IMPRESSION: Osseous demineralization. Nonspecific elbow joint effusion. No definite acute bony abnormalities identified.  Original Report Authenticated By: Lollie Marrow, M.D.   Ct Head Wo Contrast  08/31/2011  *RADIOLOGY REPORT*  Clinical Data: The patient fell about 1 week ago.  Weakness and sleepiness since the fall.  CT HEAD WITHOUT CONTRAST  Technique:  Contiguous axial images were obtained from the base of the skull through the vertex without contrast.  Comparison: 10/15/2010  Findings: Mild cerebral atrophy.  Ventricular dilatation consistent with central atrophy.  Low attenuation change in the deep white matter consistent with small vessel ischemic change.  No mass effect or midline shift.  No abnormal extra-axial fluid collections.  Gray-white matter junctions are distinct.  Basal cisterns are not effaced.  No acute intracranial hemorrhage. Visualized paranasal sinuses are not opacified.  No depressed skull fractures.  Vascular calcifications.  Stable appearance since previous study.  IMPRESSION: No evidence of acute intracranial hemorrhage, mass lesion, or acute infarct.  Chronic atrophy and small vessel ischemic changes again demonstrated.  Original Report Authenticated By: Marlon Pel, M.D.    Review of Systems  HENT: Negative.   Eyes: Negative.   Respiratory: Negative.   Cardiovascular: Negative.   Gastrointestinal: Negative.   Genitourinary: Negative.   Musculoskeletal:       Pain in the right elbow.  Skin: Negative.   Neurological: Positive for weakness.       Confusion  Endo/Heme/Allergies: Negative.   Psychiatric/Behavioral: Negative.     Blood  pressure 118/71, pulse 75, temperature 98 F (36.7 C), temperature source Oral, resp. rate 20, SpO2 92.00%. Physical Exam  Constitutional: She is oriented to person, place, and time. She appears well-developed and well-nourished. No distress.  HENT:  Head: Normocephalic and atraumatic.  Right Ear: External ear normal.  Left Ear: External ear normal.  Nose: Nose normal.  Mouth/Throat: Oropharynx is clear and moist. No oropharyngeal exudate.  Eyes: Conjunctivae and EOM are normal. Pupils are equal, round, and reactive to light. Right eye exhibits no discharge. Left eye exhibits no discharge. No scleral icterus.  Neck: Normal range of motion. Neck supple.  Cardiovascular: Normal rate.        Irregular.  Respiratory: Effort normal and breath sounds normal. No respiratory distress. She has no wheezes. She has no rales.  GI: Soft. Bowel sounds are normal. She exhibits no distension. There is no tenderness. There is no rebound.  Musculoskeletal: She exhibits no edema and no tenderness.       Not able to straighten her right elbow. States it is chronic.  Neurological: She is alert and oriented to person, place, and time.       Moves upper and lower  extremities.Good reflexes.No facial asymmetry.  Skin: Skin is warm and dry. No rash noted. She is not diaphoretic. No erythema.     Assessment/Plan #1. Generalized weakness - patient has been deconditioned. We will gently hydrate patient and stop her diuretics for now. Get PT consult. Check TSH and cardiac enzymes. #2. Right elbow pain - patient is not able to straighten her right elbow which the family states is chronic. At this time her elbow does not look inflamed. We will check a uric acid level. We will also follow PT consult. #3. Coagulopathy secondary to Coumadin - patient does not have any stigmata for bleeding we will closely follow CBC and Coumadin dosed per pharmacy. #4. Atrial fibrillation status post pacemaker placement - rate presently is  controlled we will observe. #5. Anemia - follow CBC and check anemia panel. #6. History of Parkinson's disease and gout - continue allopurinol check uric acid and continue Sinemet. #7. Mild renal insufficiency - recheck metabolic panel after hydration.  CODE STATUS - full code.  Jermisha Hoffart N. 09/01/2011, 1:08 AM

## 2011-09-01 NOTE — Progress Notes (Signed)
ANTICOAGULATION CONSULT NOTE - Initial Consult  Pharmacy Consult for Coumadin Indication: atrial fibrillation  No Known Allergies   Vital Signs: Temp: 98 F (36.7 C) (01/29 2208) Temp src: Oral (01/29 2208) BP: 118/71 mmHg (01/30 0055) Pulse Rate: 75  (01/30 0055)  Labs:  [name] 08/31/11 2039  HGB 11.3*  HCT 33.9*  PLT 154  APTT --  LABPROT 56.5*  INR 6.31*  HEPARINUNFRC --  CREATININE 1.35*  CKTOTAL 69  CKMB 3.2  TROPONINI <0.30   CrCl is unknown because there is no height on file for the current visit.  Medical History: Past Medical History  Diagnosis Date  . Coronary artery disease   . Dysrhythmia   . Hypertension     Medications:  Prescriptions prior to admission  Medication Sig Dispense Refill  . acetaminophen (TYLENOL) 325 MG tablet Take 162 mg by mouth daily.      Marland Kitchen allopurinol (ZYLOPRIM) 100 MG tablet Take 100 mg by mouth daily.      Marland Kitchen aMILoride (MIDAMOR) 5 MG tablet Take 5 mg by mouth daily.      . carbidopa-levodopa (SINEMET) 25-100 MG per tablet Take 0.5 tablets by mouth 3 (three) times daily.      . magnesium oxide (MAG-OX) 400 MG tablet Take 1,200 mg by mouth 2 (two) times daily.      Marland Kitchen warfarin (COUMADIN) 3 MG tablet Take 1.5-3 mg by mouth See admin instructions. Takes 1 tablet Sun, Mon, Wed, Thurs and Fri. Takes 0.5 tablet Tues and Sat.        Assessment: 78 yo female presents for weakness. [name]. Baseline INR 6.31. No bleeding noted. Pt seems a bit confused about coumadin dose but thinks she was taking correctly as listed above.  Goal of Therapy:  INR 2-3   Plan:  1. Hold coumadin today. 2. Daily INR. 3. If pt with s/s bleeding or INR trending up past dose being held, could consider reversing with low dose oral vitamin K.  [name] 09/01/2011,2:21 AM

## 2011-09-01 NOTE — Significant Event (Signed)
CRITICAL VALUE ALERT  Critical value received:  PT 59.9, INR 6.79  Date of notification:  09/01/11  Time of notification:  0400  Critical value read back:yes  Nurse who received alert:  C.Elbia Paro, RN.  MD notified (1st page):  Maren Reamer, NP  Time of first page:  0401  MD notified (2nd page): Maren Reamer, NP   Time of second page: [phone]  Responding MD:   Time MD responded:   MD has not yet returned page at this time 09/01/11 0505 - patient's nurse CB will continue to monitor/page as needed. The above critical values are consistent with previous values. C.Nashonda Limberg, RN.

## 2011-09-01 NOTE — Progress Notes (Signed)
Utilization review completed. [name] 09/01/2011 

## 2011-09-02 ENCOUNTER — Encounter (HOSPITAL_COMMUNITY): Payer: Self-pay | Admitting: General Practice

## 2011-09-02 DIAGNOSIS — M109 Gout, unspecified: Secondary | ICD-10-CM | POA: Diagnosis present

## 2011-09-02 DIAGNOSIS — D689 Coagulation defect, unspecified: Secondary | ICD-10-CM | POA: Diagnosis present

## 2011-09-02 LAB — CBC
MCH: 30.9 pg (ref 26.0–34.0)
MCV: 93.3 fL (ref 78.0–100.0)
Platelets: 135 10*3/uL — ABNORMAL LOW (ref 150–400)
RBC: 3.43 MIL/uL — ABNORMAL LOW (ref 3.87–5.11)
RDW: 13.3 % (ref 11.5–15.5)

## 2011-09-02 LAB — BASIC METABOLIC PANEL
CO2: 23 mEq/L (ref 19–32)
Calcium: 8.8 mg/dL (ref 8.4–10.5)
Creatinine, Ser: 1.09 mg/dL (ref 0.50–1.10)
GFR calc Af Amer: 55 mL/min — ABNORMAL LOW (ref >90)
GFR calc non Af Amer: 48 mL/min — ABNORMAL LOW (ref >90)
Sodium: 140 mEq/L (ref 135–145)

## 2011-09-02 LAB — PROTIME-INR: Prothrombin Time: 25.6 seconds — ABNORMAL HIGH (ref 11.6–15.2)

## 2011-09-02 MED ORDER — PREDNISONE 20 MG PO TABS: 40.0000 mg | ORAL_TABLET | Freq: Every day | ORAL | Status: AC

## 2011-09-02 MED ORDER — WARFARIN SODIUM 3 MG PO TABS
3.0000 mg | ORAL_TABLET | Freq: Once | ORAL | Status: AC
  Administered 2011-09-02: 3 mg via ORAL
  Filled 2011-09-02: qty 1

## 2011-09-02 NOTE — Discharge Summary (Signed)
Patient ID: [name] MRN: [id number] DOB/AGE: 10-29-1932 78 y.o.  Admit date: 08/31/2011 Discharge date: 09/02/2011  Primary Care Physician: Burnell Blanks, MD  cardiologist Lesleigh Noe, MD, MD  Discharge Diagnoses:    Principal Problem:  *Generalized weakness  Active Problems:  Coagulopathy  Acute gouty arthritis  A-fib  HTN (hypertension)   Medication List  As of 09/02/2011  3:21 PM   TAKE these medications         acetaminophen 325 MG tablet   Commonly known as: TYLENOL   Take 162 mg by mouth daily.      allopurinol 100 MG tablet   Commonly known as: ZYLOPRIM   Take 100 mg by mouth daily.      aMILoride 5 MG tablet   Commonly known as: MIDAMOR   Take 5 mg by mouth daily.      carbidopa-levodopa 25-100 MG per tablet   Commonly known as: SINEMET   Take 0.5 tablets by mouth 3 (three) times daily.      magnesium oxide 400 MG tablet   Commonly known as: MAG-OX   Take 1,200 mg by mouth 2 (two) times daily.      predniSONE 20 MG tablet   Commonly known as: DELTASONE   Take 2 tablets (40 mg total) by mouth daily with breakfast.      warfarin 3 MG tablet   Commonly known as: COUMADIN   Take 1.5-3 mg by mouth See admin instructions. Takes 1 tablet Sun, Mon, Wed, Thurs and Fri. Takes 0.5 tablet Tues and Sat.            Disposition and Follow-up: Follow up with PCP in 1 week  Consults:  none  Significant Diagnostic Studies:  Dg Chest 2 View  08/31/2011  *RADIOLOGY REPORT*  Clinical Data: Altered mental status.  CHEST - 2 VIEW  Comparison: 10/15/2010  Findings: Two views of the chest demonstrate enlargement of the cardiac silhouette.  Few densities in the left mid lung are suggestive for atelectasis.  No evidence for edema or pneumothorax. Patient has undergone vertebral augmentation procedures in the upper lumbar spine.  There is a left-sided dual lead cardiac pacemaker.  No evidence of pleural effusions.  IMPRESSION: Cardiomegaly.  Scarring or  atelectasis in the left mid lung.  Original Report Authenticated By: Richarda Overlie, M.D.   Dg Elbow Complete Right  09/01/2011  *RADIOLOGY REPORT*  Clinical Data: Right elbow pain, no recent injury, unable to fully extend  RIGHT ELBOW - COMPLETE 3+ VIEW  Comparison: None  Findings: Osseous demineralization. Joint spaces grossly preserved. Scattered small vessel vascular calcifications. Probable elbow joint effusion. Dorsal soft tissue swelling. No acute fracture, dislocation or bone destruction.  IMPRESSION: Osseous demineralization. Nonspecific elbow joint effusion. No definite acute bony abnormalities identified.  Original Report Authenticated By: Lollie Marrow, M.D.   Ct Head Wo Contrast  08/31/2011  *RADIOLOGY REPORT*  Clinical Data: The patient fell about 1 week ago.  Weakness and sleepiness since the fall.  CT HEAD WITHOUT CONTRAST  Technique:  Contiguous axial images were obtained from the base of the skull through the vertex without contrast.  Comparison: 10/15/2010  Findings: Mild cerebral atrophy.  Ventricular dilatation consistent with central atrophy.  Low attenuation change in the deep white matter consistent with small vessel ischemic change.  No mass effect or midline shift.  No abnormal extra-axial fluid collections.  Gray-white matter junctions are distinct.  Basal cisterns are not effaced.  No acute intracranial hemorrhage. Visualized paranasal sinuses are  not opacified.  No depressed skull fractures.  Vascular calcifications.  Stable appearance since previous study.  IMPRESSION: No evidence of acute intracranial hemorrhage, mass lesion, or acute infarct.  Chronic atrophy and small vessel ischemic changes again demonstrated.  Original Report Authenticated By: Marlon Pel, M.D.    Brief H and P: For complete details please refer to admission H and P, but in brief 78 year-old female with history of atrial fibrillation status post placement of placement on Coumadin, hypertension, gout and  Parkinson's disease was found on the flow 4 days ago at patient's residence. Patient lives alone. Patient was found on the floor and confused but refused to go to the ER so the family took her to PCP next day that was Friday last. Her PCP felt that patient may have had a UTI and was started on antibiotics. Despite taking this patient was still feeling weak and not able to function like she used to and mainly she is not able to walk without help. Her PCP referred her to the hospital. In the ER patient CT head just not showing anything acute her lab works are not showing anything very remarkable. Patient will be admitted for further observation. Patient at this time is alert awake oriented to place person and time. Following commands. In addition patient has some pain in the right elbow. X-rays were negative for fracture. Patient denies any chest pain short breath nausea vomiting abdominal pain or diarrhea or dysuria.   Physical Exam on Discharge:  Filed Vitals:   09/01/11 1723 09/01/11 2233 09/02/11 0629 09/02/11 0745  BP: 143/69 152/89 150/89 148/68  Pulse: 74 75 74 80  Temp: 99 F (37.2 C) 97.6 F (36.4 C) 98.1 F (36.7 C) 97.4 F (36.3 C)  TempSrc: Oral Oral Oral Oral  Resp: 18 15 13 18   SpO2: 98% 95% 96% 96%     Intake/Output Summary (Last 24 hours) at 09/02/11 1521 Last data filed at 09/02/11 0945  Gross per 24 hour  Intake    360 ml  Output      2 ml  Net    358 ml    General: elderly female in no acute distress.  HEENT: no pallor, no icterus, moist oral mucosa, no JVD, no lymphadenopathy  Heart: s1 &s2 irregular, without murmurs, rubs, gallops.  Lungs: Clear to auscultation bilaterally.  Abdomen: Soft, nontender, nondistended, positive bowel sounds.  Extremities: tender to palpation over Rt elbow with limited ROM ( much improved from admission), No clubbing cyanosis or edema with positive pedal pulses.  Neuro: Alert, awake, oriented x3, nonfocal.  CBC:    Component Value  Date/Time   WBC 3.3* 09/02/2011 0652   HGB 10.6* 09/02/2011 0652   HCT 32.0* 09/02/2011 0652   PLT 135* 09/02/2011 0652   MCV 93.3 09/02/2011 0652   NEUTROABS 3.5 08/31/2011 2039   LYMPHSABS 1.2 08/31/2011 2039   MONOABS 0.5 08/31/2011 2039   EOSABS 0.3 08/31/2011 2039   BASOSABS 0.0 08/31/2011 2039    Basic Metabolic Panel:    Component Value Date/Time   NA 140 09/02/2011 0652   K 4.3 09/02/2011 0652   CL 107 09/02/2011 0652   CO2 23 09/02/2011 0652   BUN 36* 09/02/2011 0652   CREATININE 1.09 09/02/2011 0652   GLUCOSE 162* 09/02/2011 0652   CALCIUM 8.8 09/02/2011 0652    Hospital Course:   1. Generalized weakness  Seems to be due to deconditioning. Given IV fluids with improvement PT eval  Head CT on  admission with chronic ischemic changes  TSH, vitamin B12 wnl  Seen by PT and was able to ambulate with her roller walker and does not need further PT.   2. Right elbow pain  informs to be chronic. Unable to fully extent and mildly tender on exam. X ray shows no fracture but probable effusion. uric acid mildly elevated and clinically appears to be related to acute gout On chronic allopurinol  started on a trial of po prednisone and ROM of elbow much improved from yesterday. will discharge on total 5 day course.  3. Coagulopathy on Coumadin  INR of 6.3 today. Held coumadin. No signs of bleeding . Head CT done in ED negative  given 2.5 mg vitamin K and INR therapeutic this am. Will resume coumadin Patient should have her INR checked early next week before visiting her PCP  4. Atrial fibrillation  status post pacemaker placement and on coumadin-  Rate controlled   5. AKI  Mild , now improved with hydration  Patient clinically stable to be discharged home with outpatient follow up.  i have discussed the hospital course and discharge plan with her daughter Misty Stanley over the phone      Time spent on Discharge: 45 minutes  Signed: Eddie North 09/02/2011, 3:21 PM

## 2011-09-02 NOTE — Evaluation (Signed)
Physical Therapy Evaluation and discharge Patient Details Name: [name] MRN: [id number] DOB: 07/16/1933 Today's Date: 09/02/2011  Problem List:  Patient Active Problem List  Diagnoses  . Generalized weakness  . A-fib  . HTN (hypertension)    Past Medical History:  Past Medical History  Diagnosis Date  . Coronary artery disease   . Dysrhythmia   . Hypertension    Past Surgical History:  Past Surgical History  Procedure Date  . Pacemaker placement     PT Assessment/Plan/Recommendation PT Assessment Clinical Impression Statement: Pt is a 78 y/o female admitted for generalized weakness. Today pt demonstrates modified independence with all mobility and presents with no acute PT needs. Pt is appropriate for discharge to home with support of family. Instructed pt to use her 4 wheeled walker at home. Acute PT signing off  PT Recommendation/Assessment: Patent does not need any further PT services No Skilled PT: Patient at baseline level of functioning PT Recommendation Follow Up Recommendations: No PT follow up Equipment Recommended: None recommended by PT PT Goals  Acute Rehab PT Goals PT Goal Formulation: With patient  PT Evaluation Precautions/Restrictions  Precautions Precautions: Fall Precaution Comments: Larey Seat this time trying to take her shirt off. Says she has only fallen one other time and that was when she was weak from having a colonoscopy done. Required Braces or Orthoses: No Prior Functioning  Home Living Lives With: Alone Receives Help From: Family Type of Home: House (two daughters and neice. ) Home Layout: One level Home Access: Stairs to enter Entrance Stairs-Rails: None (holds onto Manufacturing engineer) Secretary/administrator of Steps: 1 Bathroom Shower/Tub: Psychologist, counselling;Door [employer] Toilet: Standard Bathroom Accessibility: Yes How Accessible: Accessible via walker Home Adaptive Equipment: Grab bars in shower;Bedside commode/3-in-1;Straight cane;Walker -  four wheeled Prior Function Level of Independence: Independent with basic ADLs;Independent with homemaking with ambulation;Independent with gait;Independent with transfers;Requires assistive device for independence (Had been using a SPC until recently, now 4 wheel RW) Driving: Yes Vocation: Retired Financial risk analyst Arousal/Alertness: Awake/alert Overall Cognitive Status: Appears within functional limits for tasks assessed Cognition - Other Comments: Of note, pt has a life alert on her wrist and did not remember to push it when she fell thus she layed in the floor 3-4 days until someone found her Sensation/Coordination Sensation Light Touch: Appears Intact Proprioception: Appears Intact Extremity Assessment RUE Assessment RUE Assessment: Not tested LUE Assessment LUE Assessment: Not tested RLE Assessment RLE Assessment: Within Functional Limits LLE Assessment LLE Assessment: Within Functional Limits Mobility (including Balance) Bed Mobility Bed Mobility: No Transfers Transfers: Yes Sit to Stand: 6: Modified independent (Device/Increase time);With upper extremity assist;From chair/3-in-1 Stand to Sit: 7: Independent;With upper extremity assist;To chair/3-in-1 Ambulation/Gait Ambulation/Gait: Yes Ambulation/Gait Assistance: 6: Modified independent (Device/Increase time) Ambulation Distance (Feet): 100 Feet Assistive device: Rolling walker Gait Pattern: Within Functional Limits Gait velocity: WFL Stairs: No Wheelchair Mobility Wheelchair Mobility: No  Posture/Postural Control Posture/Postural Control: No significant limitations Balance Balance Assessed: Yes Static Sitting Balance Static Sitting - Balance Support: Feet supported;No upper extremity supported Static Sitting - Level of Assistance: 7: Independent Static Sitting - Comment/# of Minutes:   several minutes performing functional task  Exercise    End of Session PT - End of Session Equipment Utilized During  Treatment: Gait belt Activity Tolerance: Patient tolerated treatment well (O2 sat in 90s on Room Air throughout session. ) Patient left: in chair;with call bell in reach Nurse Communication: Mobility status for transfers;Mobility status for ambulation General Behavior During Session: [facility] for tasks performed  Cognition: WFL for tasks performed  Coti Burd 09/02/2011, 1:51 PM Bana Borgmeyer L. Skylor Schnapp DPT [phone]

## 2011-09-02 NOTE — Plan of Care (Signed)
Problem: Phase I Progression Outcomes Goal: OOB as tolerated unless otherwise ordered Outcome: Completed/Met Date Met:  09/02/11 OOB with OT.  Ignacia Palma, [place] [phone] 09/02/2011

## 2011-09-02 NOTE — Progress Notes (Deleted)
Patient ambulated length of unit hallway x 1 independently without difficulty, gait steady.

## 2011-09-02 NOTE — Progress Notes (Signed)
  ANTICOAGULATION CONSULT NOTE - Follow Up Consult  Pharmacy Consult for Coumadin Indication: atrial fibrillation  No Known Allergies  Patient Measurements:    Vital Signs: Temp: 97.4 F (36.3 C) (01/31 0745) Temp src: Oral (01/31 0745) BP: 148/68 mmHg (01/31 0745) Pulse Rate: 80  (01/31 0745)  Labs:  Alvira Philips 09/02/11 1610 09/01/11 1855 09/01/11 1018 09/01/11 0309 08/31/11 2039  HGB 10.6* -- -- 10.3* --  HCT 32.0* -- -- 31.1* 33.9*  PLT 135* -- -- 139* 154  APTT -- -- -- -- --  LABPROT 25.6* -- -- 59.9* 56.5*  INR 2.29* -- -- 6.79* 6.31*  HEPARINUNFRC -- -- -- -- --  CREATININE 1.09 -- -- 1.31* 1.35*  CKTOTAL -- 41 47 53 --  CKMB -- 2.5 2.7 2.8 --  TROPONINI -- <0.30 <0.30 <0.30 --   CrCl is unknown because there is no height on file for the current visit.   Medications:  Scheduled:    . allopurinol  100 mg Oral Daily  . carbidopa-levodopa  0.5 tablet Oral TID  . predniSONE  40 mg Oral Q breakfast   Pt received Vit K 2.5mg  PO 09/01/11.  Home Coumadin regimen = 3mg  daily except 1.5mg  on Tues and Sat.  Assessment: 78 yo F admitted with supratherapeutic INR (possibly related to outpt antibiotics for recent UTI).  INR reversed with 2.5mg  PO Vitamin K and is therapeutic today.  No bleeding noted.  Goal of Therapy:  INR 2-3   Plan:  Coumadin 3 mg PO x 1 tonight.  [employer], Pharm.D., BCPS Clinical Pharmacist Pager [phone]  09/02/2011,1:42 PM

## 2011-09-02 NOTE — Evaluation (Signed)
Occupational Therapy Evaluation Patient Details Name: [name] MRN: [id number] DOB: 09/08/1932 Today's Date: 09/02/2011 9:03-9:36  Problem List:  Patient Active Problem List  Diagnoses  . Generalized weakness  . A-fib  . HTN (hypertension)    Past Medical History:  Past Medical History  Diagnosis Date  . Coronary artery disease   . Dysrhythmia   . Hypertension    Past Surgical History:  Past Surgical History  Procedure Date  . Pacemaker placement     OT Assessment/Plan/Recommendation OT Assessment Clinical Impression Statement: This 78 yo female presents to acute OT being found for 4 days on the ground after a fall and also having a UTI. Pt with problems below as well as a h/o Parkinson's thus affecting pt's PLOF at I with BADLs/IADLs. Will benefit from acute OT with followup at SNF to get back to an I level. OT Recommendation/Assessment: Patient will need skilled OT in the acute care venue OT Problem List: Decreased strength;Impaired balance (sitting and/or standing) Barriers to Discharge: Decreased caregiver support OT Therapy Diagnosis : Generalized weakness OT Plan OT Frequency: Min 2X/week OT Treatment/Interventions: Self-care/ADL training;Balance training;Patient/family education OT Recommendation Follow Up Recommendations: Skilled nursing facility (If pt could have 24 S/prn A then could go home with HHOT) Equipment Recommended:  (possibly a seat for tub--if she D/C's home instead of SNF) Individuals Consulted Consulted and Agree with Results and Recommendations: Patient OT Goals Acute Rehab OT Goals OT Goal Formulation: With patient Time For Goal Achievement: 7 days ADL Goals Pt Will Perform Grooming: with set-up;with supervision;Standing at sink;Unsupported (At least 2 tasks) ADL Goal: Grooming - Progress: Goal set today Pt Will Perform Upper Body Dressing: with set-up;with supervision;Unsupported;Sit to stand from chair;Sit to stand from bed ADL Goal:  Upper Body Dressing - Progress: Goal set today Pt Will Perform Lower Body Dressing: with set-up;with supervision;Unsupported;Sit to stand from chair;Sit to stand from bed ADL Goal: Lower Body Dressing - Progress: Goal set today Pt Will Transfer to Toilet: with supervision;Ambulation;Comfort height toilet ADL Goal: Toilet Transfer - Progress: Goal set today  OT Evaluation Precautions/Restrictions  Precautions Precautions: Fall Precaution Comments: Larey Seat this time trying to take her shirt off. Says she has only fallen one other time and that was when she was weak from having a colonoscopy done. Required Braces or Orthoses: No Restrictions Weight Bearing Restrictions: No Prior Functioning Home Living Lives With: Alone Receives Help From: Family (only occassionally) Type of Home: House Home Layout: One level Home Access: Stairs to enter Entrance Stairs-Rails: None (holds onto door jam) Entrance Stairs-Number of Steps: 1 Bathroom Shower/Tub: Psychologist, counselling;Door [employer] Toilet: Standard Bathroom Accessibility: Yes How Accessible: Accessible via walker Home Adaptive Equipment: Grab bars in shower;Bedside commode/3-in-1;Straight cane;Walker - four wheeled Prior Function Level of Independence: Independent with basic ADLs;Independent with homemaking with ambulation;Independent with gait;Independent with transfers;Requires assistive device for independence (Had been using a SPC until recently, now 4 wheel RW) Driving: Yes Vocation: Retired ADL ADL Eating/Feeding: Simulated;Independent Where Assessed - Eating/Feeding: Edge of bed Grooming: Simulated;Set up Where Assessed - Grooming: Unsupported;Sitting, bed Upper Body Bathing: Simulated;Set up Where Assessed - Upper Body Bathing: Unsupported;Sitting, bed Lower Body Bathing: Simulated;Set up (min guard A sit to stand and stand to sit) Where Assessed - Lower Body Bathing: Unsupported;Sit to stand from bed Upper Body Dressing:  Simulated;Supervision/safety;Set up Upper Body Dressing Details (indicate cue type and reason): S for cues to put right arm in first and out last Where Assessed - Upper Body Dressing: Unsupported;Sitting, bed Lower Body Dressing:  Performed;Set up;Other (comment) (min guard A sit to stand and stand to sit) Where Assessed - Lower Body Dressing: Unsupported;Sit to stand from bed Toilet Transfer: Performed;Other (comment) (min gurad A) Toilet Transfer Method: Proofreader: Bedside commode Toileting - Clothing Manipulation: Performed;Other (comment) (min gurad A) Where Assessed - Toileting Clothing Manipulation: Standing Toileting - Hygiene: Performed;Set up;Other (comment) (min guard A) Where Assessed - Toileting Hygiene: Standing Tub/Shower Transfer: Not assessed Tub/Shower Transfer Method: Not assessed Equipment Used: Rolling walker Ambulation Related to ADLs: min guard A Vision/Perception  Vision - History Baseline Vision: Bifocals Patient Visual Report: No change from baseline Cognition Cognition Arousal/Alertness: Awake/alert Overall Cognitive Status: Appears within functional limits for tasks assessed Cognition - Other Comments: Of note, pt has a life alert on her wrist and did not remember to push it when she fell thus she layed in the floor 3-4 days until someone found her Sensation/Coordination   Extremity Assessment RUE Assessment RUE Assessment: Exceptions to [facility] RUE AROM (degrees) Right Elbow Flexion/Extension 0-135-150:  (-20 extension, -30 flexion) LUE Assessment LUE Assessment: Within Functional Limits Mobility  Bed Mobility Bed Mobility: Yes Supine to Sit: 6: Modified independent (Device/Increase time);With rails;HOB elevated (Comment degrees) (40 degrees) Sitting - Scoot to Edge of Bed: 7: Independent Transfers Transfers: Yes Sit to Stand: With upper extremity assist;From bed;Other (comment) (min guard A) Stand to Sit: With armrests;To  chair/3-in-1;Other (comment) (min guard A) Exercises   End of Session OT - End of Session Equipment Utilized During Treatment: Other (comment) (RW) Activity Tolerance: Patient tolerated treatment well Patient left: in chair;with call bell in reach (phone within reach) Nurse Communication: Mobility status for ambulation;Mobility status for transfers (with RW) General Behavior During Session: [facility] for tasks performed Cognition: [facility] for tasks performed   Evette Georges [phone] 09/02/2011, 10:03 AM

## 2011-12-14 ENCOUNTER — Other Ambulatory Visit: Payer: Self-pay | Admitting: Obstetrics and Gynecology

## 2011-12-14 DIAGNOSIS — R928 Other abnormal and inconclusive findings on diagnostic imaging of breast: Secondary | ICD-10-CM

## 2011-12-21 ENCOUNTER — Ambulatory Visit
Admission: RE | Admit: 2011-12-21 | Discharge: 2011-12-21 | Disposition: A | Discharge status: Home / Self Care | Payer: Medicare Other | Source: Ambulatory Visit | Attending: Obstetrics and Gynecology | Admitting: Obstetrics and Gynecology

## 2011-12-21 DIAGNOSIS — R928 Other abnormal and inconclusive findings on diagnostic imaging of breast: Secondary | ICD-10-CM

## 2012-04-05 ENCOUNTER — Emergency Department (HOSPITAL_COMMUNITY): Payer: Medicare Other

## 2012-04-05 ENCOUNTER — Encounter (HOSPITAL_COMMUNITY): Payer: Self-pay | Admitting: Physical Medicine and Rehabilitation

## 2012-04-05 ENCOUNTER — Inpatient Hospital Stay (HOSPITAL_COMMUNITY)
Admission: EM | Admit: 2012-04-05 | Discharge: 2012-04-12 | DRG: 291 | Disposition: A | Discharge status: Home Health | Payer: Medicare Other | Attending: Internal Medicine | Admitting: Internal Medicine

## 2012-04-05 DIAGNOSIS — I2510 Atherosclerotic heart disease of native coronary artery without angina pectoris: Secondary | ICD-10-CM | POA: Diagnosis present

## 2012-04-05 DIAGNOSIS — L02419 Cutaneous abscess of limb, unspecified: Secondary | ICD-10-CM | POA: Diagnosis present

## 2012-04-05 DIAGNOSIS — R5383 Other fatigue: Secondary | ICD-10-CM

## 2012-04-05 DIAGNOSIS — J9600 Acute respiratory failure, unspecified whether with hypoxia or hypercapnia: Secondary | ICD-10-CM | POA: Diagnosis present

## 2012-04-05 DIAGNOSIS — Z79899 Other long term (current) drug therapy: Secondary | ICD-10-CM

## 2012-04-05 DIAGNOSIS — N189 Chronic kidney disease, unspecified: Secondary | ICD-10-CM | POA: Diagnosis present

## 2012-04-05 DIAGNOSIS — I499 Cardiac arrhythmia, unspecified: Secondary | ICD-10-CM

## 2012-04-05 DIAGNOSIS — R011 Cardiac murmur, unspecified: Secondary | ICD-10-CM | POA: Insufficient documentation

## 2012-04-05 DIAGNOSIS — N289 Disorder of kidney and ureter, unspecified: Secondary | ICD-10-CM

## 2012-04-05 DIAGNOSIS — I442 Atrioventricular block, complete: Secondary | ICD-10-CM | POA: Insufficient documentation

## 2012-04-05 DIAGNOSIS — G20A1 Parkinson's disease without dyskinesia, without mention of fluctuations: Secondary | ICD-10-CM | POA: Diagnosis present

## 2012-04-05 DIAGNOSIS — N179 Acute kidney failure, unspecified: Secondary | ICD-10-CM | POA: Diagnosis present

## 2012-04-05 DIAGNOSIS — R531 Weakness: Secondary | ICD-10-CM

## 2012-04-05 DIAGNOSIS — R0602 Shortness of breath: Secondary | ICD-10-CM | POA: Insufficient documentation

## 2012-04-05 DIAGNOSIS — G709 Myoneural disorder, unspecified: Secondary | ICD-10-CM

## 2012-04-05 DIAGNOSIS — E875 Hyperkalemia: Secondary | ICD-10-CM | POA: Diagnosis present

## 2012-04-05 DIAGNOSIS — I5043 Acute on chronic combined systolic (congestive) and diastolic (congestive) heart failure: Principal | ICD-10-CM | POA: Diagnosis present

## 2012-04-05 DIAGNOSIS — D689 Coagulation defect, unspecified: Secondary | ICD-10-CM

## 2012-04-05 DIAGNOSIS — G20 Parkinson's disease: Secondary | ICD-10-CM | POA: Diagnosis present

## 2012-04-05 DIAGNOSIS — I509 Heart failure, unspecified: Secondary | ICD-10-CM | POA: Diagnosis present

## 2012-04-05 DIAGNOSIS — M109 Gout, unspecified: Secondary | ICD-10-CM

## 2012-04-05 DIAGNOSIS — E860 Dehydration: Secondary | ICD-10-CM | POA: Diagnosis present

## 2012-04-05 DIAGNOSIS — I129 Hypertensive chronic kidney disease with stage 1 through stage 4 chronic kidney disease, or unspecified chronic kidney disease: Secondary | ICD-10-CM | POA: Diagnosis present

## 2012-04-05 DIAGNOSIS — Z950 Presence of cardiac pacemaker: Secondary | ICD-10-CM

## 2012-04-05 DIAGNOSIS — M1990 Unspecified osteoarthritis, unspecified site: Secondary | ICD-10-CM | POA: Insufficient documentation

## 2012-04-05 DIAGNOSIS — I10 Essential (primary) hypertension: Secondary | ICD-10-CM | POA: Insufficient documentation

## 2012-04-05 DIAGNOSIS — I4891 Unspecified atrial fibrillation: Secondary | ICD-10-CM | POA: Diagnosis present

## 2012-04-05 HISTORY — DX: Heart failure, unspecified: I50.9

## 2012-04-05 LAB — COMPREHENSIVE METABOLIC PANEL
ALT: 13 U/L (ref 0–35)
AST: 29 U/L (ref 0–37)
Albumin: 4.1 g/dL (ref 3.5–5.2)
CO2: 24 mEq/L (ref 19–32)
Calcium: 10.4 mg/dL (ref 8.4–10.5)
GFR calc non Af Amer: 27 mL/min — ABNORMAL LOW (ref >90)
Sodium: 139 mEq/L (ref 135–145)

## 2012-04-05 LAB — BASIC METABOLIC PANEL
CO2: 25 mEq/L (ref 19–32)
Chloride: 105 mEq/L (ref 96–112)
GFR calc non Af Amer: 29 mL/min — ABNORMAL LOW (ref >90)
Glucose, Bld: 101 mg/dL — ABNORMAL HIGH (ref 70–99)
Potassium: 5.3 mEq/L — ABNORMAL HIGH (ref 3.5–5.1)
Sodium: 140 mEq/L (ref 135–145)

## 2012-04-05 LAB — CBC WITH DIFFERENTIAL/PLATELET
Basophils Absolute: 0 10*3/uL (ref 0.0–0.1)
Basophils Relative: 1 % (ref 0–1)
Eosinophils Relative: 4 % (ref 0–5)
Lymphocytes Relative: 24 % (ref 12–46)
MCHC: 33.7 g/dL (ref 30.0–36.0)
MCV: 97.1 fL (ref 78.0–100.0)
Neutro Abs: 3 10*3/uL (ref 1.7–7.7)
Platelets: 122 10*3/uL — ABNORMAL LOW (ref 150–400)
RDW: 16.9 % — ABNORMAL HIGH (ref 11.5–15.5)
WBC: 4.8 10*3/uL (ref 4.0–10.5)

## 2012-04-05 LAB — URINALYSIS, ROUTINE W REFLEX MICROSCOPIC
Glucose, UA: NEGATIVE mg/dL
Hgb urine dipstick: NEGATIVE
Ketones, ur: NEGATIVE mg/dL
Leukocytes, UA: NEGATIVE
Nitrite: NEGATIVE
Protein, ur: 30 mg/dL — AB
Specific Gravity, Urine: 1.02 (ref 1.005–1.030)
pH: 6 (ref 5.0–8.0)

## 2012-04-05 LAB — TROPONIN I: Troponin I: <0.3 ng/mL (ref <0.30)

## 2012-04-05 LAB — URINE MICROSCOPIC-ADD ON

## 2012-04-05 LAB — TSH: TSH: 1.547 u[IU]/mL (ref 0.350–4.500)

## 2012-04-05 LAB — MAGNESIUM: Magnesium: 2 mg/dL (ref 1.5–2.5)

## 2012-04-05 MED ORDER — VANCOMYCIN HCL 500 MG IV SOLR
500.0000 mg | INTRAVENOUS | Status: DC
  Administered 2012-04-05 – 2012-04-07 (×3): 500 mg via INTRAVENOUS
  Filled 2012-04-05 (×5): qty 500

## 2012-04-05 MED ORDER — SODIUM POLYSTYRENE SULFONATE 15 GM/60ML PO SUSP
15.0000 g | Freq: Once | ORAL | Status: AC
  Administered 2012-04-05: 15 g via ORAL
  Filled 2012-04-05: qty 60

## 2012-04-05 MED ORDER — ALLOPURINOL 100 MG PO TABS
100.0000 mg | ORAL_TABLET | Freq: Every day | ORAL | Status: DC
  Administered 2012-04-05 – 2012-04-12 (×8): 100 mg via ORAL
  Filled 2012-04-05 (×8): qty 1

## 2012-04-05 MED ORDER — SODIUM CHLORIDE 0.9 % IV BOLUS (SEPSIS)
250.0000 mL | Freq: Once | INTRAVENOUS | Status: AC
  Administered 2012-04-05: 250 mL via INTRAVENOUS

## 2012-04-05 MED ORDER — METOPROLOL TARTRATE 50 MG PO TABS
50.0000 mg | ORAL_TABLET | Freq: Two times a day (BID) | ORAL | Status: DC
  Administered 2012-04-05 – 2012-04-12 (×13): 50 mg via ORAL
  Filled 2012-04-05 (×15): qty 1

## 2012-04-05 MED ORDER — AMILORIDE HCL 5 MG PO TABS
10.0000 mg | ORAL_TABLET | Freq: Every day | ORAL | Status: DC
  Administered 2012-04-05 – 2012-04-12 (×8): 10 mg via ORAL
  Filled 2012-04-05 (×8): qty 2

## 2012-04-05 MED ORDER — ONDANSETRON HCL 4 MG/2ML IJ SOLN: 4.0000 mg | Freq: Four times a day (QID) | INTRAMUSCULAR | Status: DC | PRN

## 2012-04-05 MED ORDER — ENOXAPARIN SODIUM 30 MG/0.3ML ~~LOC~~ SOLN
30.0000 mg | SUBCUTANEOUS | Status: DC
  Administered 2012-04-05 – 2012-04-11 (×7): 30 mg via SUBCUTANEOUS
  Filled 2012-04-05 (×9): qty 0.3

## 2012-04-05 MED ORDER — CARBIDOPA-LEVODOPA 25-100 MG PO TABS
0.5000 | ORAL_TABLET | Freq: Three times a day (TID) | ORAL | Status: DC
  Administered 2012-04-05 – 2012-04-12 (×22): 0.5 via ORAL
  Filled 2012-04-05 (×23): qty 0.5

## 2012-04-05 MED ORDER — SODIUM CHLORIDE 0.9 % IV SOLN: INTRAVENOUS | Status: DC

## 2012-04-05 MED ORDER — FUROSEMIDE 10 MG/ML IJ SOLN
40.0000 mg | Freq: Once | INTRAMUSCULAR | Status: AC
  Administered 2012-04-05: 40 mg via INTRAVENOUS
  Filled 2012-04-05: qty 4

## 2012-04-05 MED ORDER — HYDROCODONE-ACETAMINOPHEN 5-325 MG PO TABS
1.0000 | ORAL_TABLET | ORAL | Status: DC | PRN
  Administered 2012-04-09 – 2012-04-12 (×3): 1 via ORAL
  Filled 2012-04-05 (×3): qty 1

## 2012-04-05 MED ORDER — MAGNESIUM OXIDE 400 MG PO TABS
1200.0000 mg | ORAL_TABLET | Freq: Two times a day (BID) | ORAL | Status: DC
  Administered 2012-04-05 – 2012-04-12 (×14): 1200 mg via ORAL
  Filled 2012-04-05 (×15): qty 3

## 2012-04-05 MED ORDER — SODIUM CHLORIDE 0.9 % IJ SOLN
3.0000 mL | Freq: Two times a day (BID) | INTRAMUSCULAR | Status: DC
  Administered 2012-04-05 – 2012-04-11 (×10): 3 mL via INTRAVENOUS

## 2012-04-05 MED ORDER — SODIUM CHLORIDE 0.9 % IJ SOLN
3.0000 mL | Freq: Two times a day (BID) | INTRAMUSCULAR | Status: DC
  Administered 2012-04-06 – 2012-04-12 (×8): 3 mL via INTRAVENOUS

## 2012-04-05 MED ORDER — BIOTENE DRY MOUTH MT LIQD
15.0000 mL | Freq: Two times a day (BID) | OROMUCOSAL | Status: DC
  Administered 2012-04-05 – 2012-04-11 (×13): 15 mL via OROMUCOSAL

## 2012-04-05 MED ORDER — ONDANSETRON HCL 4 MG PO TABS: 4.0000 mg | ORAL_TABLET | Freq: Four times a day (QID) | ORAL | Status: DC | PRN

## 2012-04-05 MED ORDER — SODIUM CHLORIDE 0.9 % IV SOLN: 250.0000 mL | INTRAVENOUS | Status: DC | PRN

## 2012-04-05 MED ORDER — LOPERAMIDE HCL 2 MG PO TABS
2.0000 mg | ORAL_TABLET | Freq: Two times a day (BID) | ORAL | Status: DC
  Administered 2012-04-06: 4 mg via ORAL
  Administered 2012-04-06: 2 mg via ORAL
  Administered 2012-04-07: 4 mg via ORAL
  Administered 2012-04-08 – 2012-04-12 (×7): 2 mg via ORAL
  Filled 2012-04-05 (×15): qty 2

## 2012-04-05 MED ORDER — ASPIRIN EC 81 MG PO TBEC
81.0000 mg | DELAYED_RELEASE_TABLET | Freq: Every day | ORAL | Status: DC
  Administered 2012-04-05 – 2012-04-12 (×8): 81 mg via ORAL
  Filled 2012-04-05 (×8): qty 1

## 2012-04-05 MED ORDER — SODIUM CHLORIDE 0.9 % IJ SOLN: 3.0000 mL | INTRAMUSCULAR | Status: DC | PRN

## 2012-04-05 NOTE — Progress Notes (Addendum)
Pt arrived to floor from ED. Alert and oriented, but confused at times. VSS. C/O left upper quadrant abdominal pain. Oriented pt to room. Bed in low position. Call bell within reach. Instructed to not get out of bed without assistance. Dr. Verta Ellen paged.

## 2012-04-05 NOTE — ED Provider Notes (Signed)
History     CSN: [id number]  Arrival date & time 04/05/12  1106   First MD Initiated Contact with Patient 04/05/12 1252      Chief Complaint  Patient presents with  . Altered Mental Status  . Fall    (Consider location/radiation/quality/duration/timing/severity/associated sxs/prior treatment) Patient is a 78 y.o. female presenting with altered mental status and fall. The history is provided by the patient.  Altered Mental Status Associated symptoms include shortness of breath. Pertinent negatives include no chest pain, no abdominal pain and no headaches.  Fall Pertinent negatives include no fever, no abdominal pain and no headaches.  pt with generalized weakness for past 2 weeks. Hx chf, notes bil leg swelling has increased. Notes increased sob, gradually worsening x 2 weeks. No pnd. No cp. Saw her cardiologist last week, Dr Katrinka Blazing, had lasix dose doubled, but feels no better. Denies cough or uri c/o. No fever or chills. No headache. No abd pain. No nv. Chronic diarrhea, takes imodium on regular basis for same, no recent change. No gu c/o. Denies trauma or fall.   Past Medical History  Diagnosis Date  . Coronary artery disease   . Hypertension   . Heart murmur   . Shortness of breath   . Dysrhythmia     atrial fib  . Arthritis   . Neuromuscular disorder     parkinsons  . CHF (congestive heart failure)     Past Surgical History  Procedure Date  . Pacemaker placement   . Insert / replace / remove pacemaker   . Abdominal hysterectomy   . Cholecystectomy   . Toe nails     removed  . Bladder tack     Family History  Problem Relation Age of Onset  . Stroke Sister   . Parkinsonism Brother     History  Substance Use Topics  . Smoking status: Never Smoker   . Smokeless tobacco: Never Used  . Alcohol Use: No    OB History    Grav Para Term Preterm Abortions TAB SAB Ect Mult Living                  Review of Systems  Constitutional: Negative for fever and  chills.  HENT: Negative for neck pain.   Eyes: Negative for visual disturbance.  Respiratory: Positive for shortness of breath.   Cardiovascular: Positive for leg swelling. Negative for chest pain.  Gastrointestinal: Negative for abdominal pain.  Genitourinary: Negative for flank pain.  Musculoskeletal: Negative for back pain.  Skin: Negative for rash.  Neurological: Negative for headaches.  Hematological: Does not bruise/bleed easily.  Psychiatric/Behavioral: Positive for altered mental status. Negative for dysphoric mood.    Allergies  Review of patient's allergies indicates no known allergies.  Home Medications   Current Outpatient Rx  Name Route Sig Dispense Refill  . ALLOPURINOL 100 MG PO TABS Oral Take 100 mg by mouth daily.    . AMILORIDE HCL 5 MG PO TABS Oral Take 10 mg by mouth daily.     . ASPIRIN EC 81 MG PO TBEC Oral Take 81 mg by mouth daily.    Marland Kitchen CARBIDOPA-LEVODOPA 25-100 MG PO TABS Oral Take 0.5 tablets by mouth 3 (three) times daily.    Marland Kitchen MAGNESIUM OXIDE 400 MG PO TABS Oral Take 1,200 mg by mouth 2 (two) times daily.    . ACETAMINOPHEN 325 MG PO TABS Oral Take 162 mg by mouth daily.      BP 121/79  Pulse 75  Temp 98 F (36.7 C) (Oral)  Resp 16  SpO2 97%  Physical Exam  Nursing note and vitals reviewed. Constitutional: She is oriented to person, place, and time. She appears well-developed and well-nourished. No distress.  HENT:  Mouth/Throat: Oropharynx is clear and moist.  Eyes: Conjunctivae are normal. Pupils are equal, round, and reactive to light. No scleral icterus.  Neck: Neck supple. No JVD present. No tracheal deviation present.  Cardiovascular: Normal rate, normal heart sounds and intact distal pulses.   Pulmonary/Chest: Effort normal and breath sounds normal. No respiratory distress.  Abdominal: Soft. Normal appearance and bowel sounds are normal. She exhibits no distension. There is no tenderness.  Genitourinary:       No cva tenderness    Musculoskeletal:       bil lower ext edema to knees.   Neurological: She is alert and oriented to person, place, and time.  Skin: Skin is warm and dry. No rash noted.  Psychiatric: She has a normal mood and affect.    ED Course  Procedures (including critical care time)   Labs Reviewed  CBC WITH DIFFERENTIAL  COMPREHENSIVE METABOLIC PANEL   Results for orders placed during the hospital encounter of 04/05/12  CBC WITH DIFFERENTIAL      Component Value Range   WBC 4.8  4.0 - 10.5 K/uL   RBC 4.77  3.87 - 5.11 MIL/uL   Hemoglobin 15.6 (*) 12.0 - 15.0 g/dL   HCT 16.1 (*) 09.6 - 04.5 %   MCV 97.1  78.0 - 100.0 fL   MCH 32.7  26.0 - 34.0 pg   MCHC 33.7  30.0 - 36.0 g/dL   RDW 40.9 (*) 81.1 - 91.4 %   Platelets 122 (*) 150 - 400 K/uL   Neutrophils Relative 63  43 - 77 %   Neutro Abs 3.0  1.7 - 7.7 K/uL   Lymphocytes Relative 24  12 - 46 %   Lymphs Abs 1.2  0.7 - 4.0 K/uL   Monocytes Relative 9  3 - 12 %   Monocytes Absolute 0.4  0.1 - 1.0 K/uL   Eosinophils Relative 4  0 - 5 %   Eosinophils Absolute 0.2  0.0 - 0.7 K/uL   Basophils Relative 1  0 - 1 %   Basophils Absolute 0.0  0.0 - 0.1 K/uL  COMPREHENSIVE METABOLIC PANEL      Component Value Range   Sodium 139  135 - 145 mEq/L   Potassium 5.4 (*) 3.5 - 5.1 mEq/L   Chloride 104  96 - 112 mEq/L   CO2 24  19 - 32 mEq/L   Glucose, Bld 99  70 - 99 mg/dL   BUN 43 (*) 6 - 23 mg/dL   Creatinine, Ser 7.82 (*) 0.50 - 1.10 mg/dL   Calcium 95.6  8.4 - 21.3 mg/dL   Total Protein 6.7  6.0 - 8.3 g/dL   Albumin 4.1  3.5 - 5.2 g/dL   AST 29  0 - 37 U/L   ALT 13  0 - 35 U/L   Alkaline Phosphatase 143 (*) 39 - 117 U/L   Total Bilirubin 1.5 (*) 0.3 - 1.2 mg/dL   GFR calc non Af Amer 27 (*) >90 mL/min   GFR calc Af Amer 31 (*) >90 mL/min  URINALYSIS, ROUTINE W REFLEX MICROSCOPIC      Component Value Range   Color, Urine YELLOW  YELLOW   APPearance CLEAR  CLEAR   Specific Gravity, Urine 1.020  1.005 -  1.030   pH 6.0  5.0 - 8.0    Glucose, UA NEGATIVE  NEGATIVE mg/dL   Hgb urine dipstick NEGATIVE  NEGATIVE   Bilirubin Urine SMALL (*) NEGATIVE   Ketones, ur NEGATIVE  NEGATIVE mg/dL   Protein, ur 30 (*) NEGATIVE mg/dL   Urobilinogen, UA 1.0  0.0 - 1.0 mg/dL   Nitrite NEGATIVE  NEGATIVE   Leukocytes, UA NEGATIVE  NEGATIVE  PRO B NATRIURETIC PEPTIDE      Component Value Range   Pro B Natriuretic peptide (BNP) 8496.0 (*) 0 - 450 pg/mL  TROPONIN I      Component Value Range   Troponin I <0.30  <0.30 ng/mL  URINE MICROSCOPIC-ADD ON      Component Value Range   Squamous Epithelial / LPF FEW (*) RARE   WBC, UA 0-2  <3 WBC/hpf   RBC / HPF 0-2  <3 RBC/hpf   Bacteria, UA RARE  RARE   Casts HYALINE CASTS (*) NEGATIVE   Dg Chest 2 View  04/05/2012  *RADIOLOGY REPORT*  Clinical Data: Shortness of breath, leg swelling  CHEST - 2 VIEW  Comparison: Chest x-ray of 08/31/2011  Findings: Significant cardiomegaly is again noted.  No focal infiltrate or effusion is seen.  There may be minimal fullness of perihilar vasculature which may indicate mild fluid overload but no congestive heart failure is evident.  A permanent pacemaker is present.  The bones are osteopenic.  Lower thoracic and or upper lumbar vertebroplasties are noted.  IMPRESSION: Stable cardiomegaly with pacemaker.  Question mild fluid overload.   Original Report Authenticated By: Juline Patch, M.D.       MDM  Iv ns. Labs.  Reviewed nursing notes and prior charts for additional history.    Date: 04/05/2012  Rate: 75  Rhythm: paced  QRS Axis: left  Intervals: paced  ST/T Wave abnormalities: nonspecific ST/T changes  Conduction Disutrbances:paced  Narrative Interpretation:   Old EKG Reviewed: unchanged   pts volume status somewhat unclear. pts cardiologist had doubled her lasix dose 1-2 weeks ago (note lasix not listed on current med list), but daughter states pt became much worse post increase in lasix dose, has new renal insuff similar to prior when was  volume depleted, and hgb increased as compared to prior.  Will give small ns bolus in ed. Daughter reports hx v low mg, added to labs.    [facility] called - discussed pt, labs incl renal fxn, bnp, cxr, etc - they request temp orders to team 10, iv @ [facility], gen med bed        Suzi Roots, MD 04/05/12 1430

## 2012-04-05 NOTE — Progress Notes (Signed)
ANTIBIOTIC CONSULT NOTE - INITIAL  Pharmacy Consult for Vancomycin Indication: B/L lower extremity cellulitis  No Known Allergies  Patient Measurements: Height: 5' (152.4 cm) Weight: 135 lb 9.3 oz (61.5 kg) IBW/kg (Calculated) : 45.5    Vital Signs: Temp: 97.5 F (36.4 C) (09/04 1655) Temp src: Oral (09/04 1112) BP: 118/72 mmHg (09/04 1655) Pulse Rate: 75  (09/04 1655) Intake/Output from previous day:   Intake/Output from this shift:    Labs:  Basename 04/05/12 1751 04/05/12 1238  WBC -- 4.8  HGB -- 15.6*  PLT -- 122*  LABCREA -- --  CREATININE 1.65* 1.74*   Estimated Creatinine Clearance: 23.4 ml/min (by C-G formula based on Cr of 1.65). No results found for this basename: VANCOTROUGH:2,VANCOPEAK:2,VANCORANDOM:2,GENTTROUGH:2,GENTPEAK:2,GENTRANDOM:2,TOBRATROUGH:2,TOBRAPEAK:2,TOBRARND:2,AMIKACINPEAK:2,AMIKACINTROU:2,AMIKACIN:2, in the last 72 hours   Microbiology: No results found for this or any previous visit (from the past 720 hour(s)).  Medical History: Past Medical History  Diagnosis Date  . Coronary artery disease   . Hypertension   . Heart murmur   . Shortness of breath   . Dysrhythmia     atrial fib  . Arthritis   . Neuromuscular disorder     parkinsons  . CHF (congestive heart failure)     Assessment: 78 y.o. F who presented to the [facility] on 9/4 with SOB and was found to have B/L lower extremity swelling. Pharmacy was consulted to dose Vancomycin for empiric cellulitis coverage. Wt: 61.5 kg, SCr 1.65, CrCl~20-30 ml/min.  Goal of Therapy:  Vancomycin trough level 10-15 mcg/ml  Plan:  1. Vancomycin 500 mg IV every 24 hours 2. Will continue to follow renal function, culture results, LOT, and antibiotic de-escalation plans   Georgina Pillion, PharmD, BCPS Clinical Pharmacist Pager: [phone] 04/05/2012 8:37 PM

## 2012-04-05 NOTE — Progress Notes (Signed)
Disposition Note  [name], is a 78 y.o. female,   MRN: [id number]  -  DOB - May 27, 1933  Outpatient Primary MD for the patient is HAMRICK,MAURA L, MD   Blood pressure 121/79, pulse 75, temperature 98 F (36.7 C), temperature source Oral, resp. rate 16, SpO2 97.00%.  Active Problems:  CHF (congestive heart failure)  Dehydration  Acute on chronic renal failure   78 yo female with PMH significant for Parkinsons, afib, CAD, HTN, coagulopathy.    Pt presents to department for evaluation of confusion and falls. Pt states she lives at home by herself and family is concerned for her safety.   She has been becoming progressively weaker, with SOB, and increasing leg edema for 2 weeks.    Initially in the ED her pulse ox was in the low 80s on room air.  It returns to normal with oxygen.  Her BNP is 8496.  Her creatine trend goes up and down but is currently elevated at 1.74.    She recently saw her cardiologist (Dr. Verdis Prime) who doubled her lasix in order to help her Lower Extremity edema.  I have requested a med surg bed for CHF exacerbation with acute on chronic renal failure, dehydration and hyperkalemia.  Patient will likely need placement at discharge as she lives at home alone.   Algis Downs, PA-C [facility] Pager: [phone]

## 2012-04-05 NOTE — H&P (Signed)
[facility] History and Physical  [name]:[id number] DOB: 1933/06/07 DOA: 04/05/2012  Referring physician: ED physician PCP: Ailene Ravel, MD   Chief Complaint: Shortness of breath  HPI:  Pt is 78 yo female with PMH significant for Parkinsons, afib, CAD, HTN, coagulopathy and who presents to ED with main concern of progressively worsening shortness of breath associated with generalized weakness, confusion, falls at home. This has initially started approximately 2 weeks ago and pt has also noted progressively worsening lower extremity swelling. She also reports poor oral intake, unable to carry out regular daily activities as before. Pt lives by herself and is concerned for her safety. In ED pt had oxygen saturation drop to 80's on RA. She has had ose of Lasix increased recently by Dr. Verdis Prime, her cardiologist.   Assessment and Plan: Active Problems:  CHF (congestive heart failure) - based on BNP and higher dry weight but per Hg value and clinically slightly hemoconcentrated - will follow up on daily weights, I's and O's - repeat BMP in AM - will give one dose of Lasix for now as pt has significant lower extremity edema   Bilateral lower extremities cellulitis - will initiate empiric Vancomycin for now and will keep extremities elevated   Dehydration - based on Hg/Hct - will monitor clinical status as it is somewhat difficult to determine volume status - will give only one dose of lasix  - encourage PO intake - CBC and BMP in A   Acute on chronic renal failure - creatinine is slightly up from baseline - BMP in AM  Code Status: Full Family Communication: Pt at bedside Disposition Plan: PT evaluation    Review of Systems:  Constitutional: Negative for fever, chills, positive for malaise/fatigue. Negative for diaphoresis.  HENT: Negative for hearing loss, ear pain, nosebleeds, congestion, sore throat, neck pain, tinnitus and ear discharge.   Eyes:  Negative for blurred vision, double vision, photophobia, pain, discharge and redness.  Respiratory: Negative for cough, hemoptysis, sputum production, positive for shortness of breath, negative for wheezing and stridor.   Cardiovascular: Negative for chest pain, palpitations, positive for orthopnea, and leg swelling.  Gastrointestinal: Negative for nausea, vomiting and abdominal pain. Negative for heartburn, constipation, blood in stool and melena.  Genitourinary: Negative for dysuria, urgency, frequency, hematuria and flank pain.  Musculoskeletal: Negative for myalgias, back pain, joint pain and falls.  Skin: Negative for itching and rash.  Neurological: Negative for dizziness and weakness. Negative for tingling, tremors, sensory change, speech change, focal weakness, loss of consciousness and headaches.  Endo/Heme/Allergies: Negative for environmental allergies and polydipsia. Does not bruise/bleed easily.  Psychiatric/Behavioral: Negative for suicidal ideas. The patient is not nervous/anxious.      Past Medical History  Diagnosis Date  . Coronary artery disease   . Hypertension   . Heart murmur   . Shortness of breath   . Dysrhythmia     atrial fib  . Arthritis   . Neuromuscular disorder     parkinsons  . CHF (congestive heart failure)     Past Surgical History  Procedure Date  . Pacemaker placement   . Insert / replace / remove pacemaker   . Abdominal hysterectomy   . Cholecystectomy   . Toe nails     removed  . Bladder tack     Social History:  reports that she has never smoked. She has never used smokeless tobacco. She reports that she does not drink alcohol or use illicit drugs.  No Known  Allergies  Family History  Problem Relation Age of Onset  . Stroke Sister   . Parkinsonism Brother     Prior to Admission medications   Medication Sig Start Date End Date Taking? Authorizing Provider  acetaminophen (TYLENOL) 325 MG tablet Take 162 mg by mouth daily.   Yes  Historical Provider, MD  allopurinol (ZYLOPRIM) 100 MG tablet Take 100 mg by mouth daily.   Yes Historical Provider, MD  aMILoride (MIDAMOR) 5 MG tablet Take 10 mg by mouth daily.    Yes Historical Provider, MD  aspirin EC 81 MG tablet Take 81 mg by mouth daily.   Yes Historical Provider, MD  carbidopa-levodopa (SINEMET) 25-100 MG per tablet Take 0.5 tablets by mouth 3 (three) times daily.   Yes Historical Provider, MD  loperamide (IMODIUM A-D) 2 MG tablet Take 2-4 mg by mouth 2 (two) times daily. 4mg  in am, 2mg  in pm   Yes Historical Provider, MD  magnesium oxide (MAG-OX) 400 MG tablet Take 1,200 mg by mouth 2 (two) times daily.   Yes Historical Provider, MD  metoprolol (LOPRESSOR) 50 MG tablet Take 50 mg by mouth 2 (two) times daily.   Yes Historical Provider, MD  OVER THE COUNTER MEDICATION Take 1 tablet by mouth daily. Calcium   Yes Historical Provider, MD    Physical Exam: Filed Vitals:   04/05/12 1112 04/05/12 1330 04/05/12 1655  BP: 121/79  118/72  Pulse: 75 75 75  Temp: 98 F (36.7 C)  97.5 F (36.4 C)  TempSrc: Oral    Resp: 16 15 15   Height:   5' (1.524 m)  Weight:   61.5 kg (135 lb 9.3 oz)  SpO2: 97% 94% 91%    Physical Exam  Constitutional: Appears well-developed and well-nourished. No distress.  HENT: Normocephalic. External right and left ear normal. Oropharynx is clear and moist.  Eyes: Conjunctivae and EOM are normal. PERRLA, no scleral icterus.  Neck: Normal ROM. Neck supple. No JVD. No tracheal deviation. No thyromegaly.  CVS: RRR, S1/S2 +, no murmurs, no gallops, no carotid bruit.  Pulmonary: Bibasilar crackles and slightly increased work of breathing Abdominal: Soft. BS +,  no distension, tenderness, rebound or guarding.  Musculoskeletal: Normal range of motion. Bilateral lower extremity edema but no tenderness. Redness in bilateral lower extremities and warmth to touch Lymphadenopathy: No lymphadenopathy noted, cervical, inguinal. Neuro: Alert. Normal  reflexes, muscle tone coordination. No cranial nerve deficit. Skin: Skin is warm and dry. No rash noted. Not diaphoretic. No erythema. No pallor.  Psychiatric: Normal mood and affect. Behavior, judgment, thought content normal.   Labs on Admission:  Basic Metabolic Panel:  Lab 04/05/12 5409 04/05/12 1238  NA -- 139  K -- 5.4*  CL -- 104  CO2 -- 24  GLUCOSE -- 99  BUN -- 43*  CREATININE -- 1.74*  CALCIUM -- 10.4  MG 2.0 --  PHOS -- --   Liver Function Tests:  Lab 04/05/12 1238  AST 29  ALT 13  ALKPHOS 143*  BILITOT 1.5*  PROT 6.7  ALBUMIN 4.1   No results found for this basename: LIPASE:5,AMYLASE:5 in the last 168 hours No results found for this basename: AMMONIA:5 in the last 168 hours CBC:  Lab 04/05/12 1238  WBC 4.8  NEUTROABS 3.0  HGB 15.6*  HCT 46.3*  MCV 97.1  PLT 122*   Cardiac Enzymes:  Lab 04/05/12 1307  CKTOTAL --  CKMB --  CKMBINDEX --  TROPONINI <0.30   BNP: No components found with this  basename: POCBNP:5 CBG: No results found for this basename: GLUCAP:5 in the last 168 hours  Radiological Exams on Admission: Dg Chest 2 View  04/05/2012  *RADIOLOGY REPORT*  Clinical Data: Shortness of breath, leg swelling  CHEST - 2 VIEW  Comparison: Chest x-ray of 08/31/2011  Findings: Significant cardiomegaly is again noted.  No focal infiltrate or effusion is seen.  There may be minimal fullness of perihilar vasculature which may indicate mild fluid overload but no congestive heart failure is evident.  A permanent pacemaker is present.  The bones are osteopenic.  Lower thoracic and or upper lumbar vertebroplasties are noted.  IMPRESSION: Stable cardiomegaly with pacemaker.  Question mild fluid overload.   Original Report Authenticated By: Juline Patch, M.D.     EKG: Normal sinus rhythm, no ST/T wave changes  Debbora Presto, MD  [facility] Pager [phone]  If 7PM-7AM, please contact night-coverage [email or web address] Password  [facility] 04/05/2012, 5:36 PM

## 2012-04-05 NOTE — ED Notes (Signed)
Pt presents to department for evaluation of confusion and falls. Pt states she lives at home by herself and family is concerned for her safety. Upon arrival bilateral lower leg swelling noted. Denies pain. Pt is alert and can answer questions appropriately at the time.

## 2012-04-05 NOTE — ED Notes (Signed)
MD at bedside. 

## 2012-04-06 LAB — CBC
MCH: 33.3 pg (ref 26.0–34.0)
MCHC: 33.9 g/dL (ref 30.0–36.0)
Platelets: 130 10*3/uL — ABNORMAL LOW (ref 150–400)
RBC: 4.29 MIL/uL (ref 3.87–5.11)

## 2012-04-06 LAB — BASIC METABOLIC PANEL
Calcium: 9.6 mg/dL (ref 8.4–10.5)
GFR calc non Af Amer: 27 mL/min — ABNORMAL LOW (ref >90)
Glucose, Bld: 86 mg/dL (ref 70–99)
Sodium: 142 mEq/L (ref 135–145)

## 2012-04-06 LAB — GLUCOSE, CAPILLARY: Glucose-Capillary: 97 mg/dL (ref 70–99)

## 2012-04-06 LAB — TROPONIN I: Troponin I: <0.3 ng/mL (ref <0.30)

## 2012-04-06 MED ORDER — FUROSEMIDE 10 MG/ML IJ SOLN
40.0000 mg | Freq: Every day | INTRAMUSCULAR | Status: DC
  Administered 2012-04-06 – 2012-04-08 (×3): 40 mg via INTRAVENOUS
  Filled 2012-04-06 (×3): qty 4

## 2012-04-06 NOTE — Progress Notes (Addendum)
[facility] PROGRESS NOTE  [name] YQM:[id number] DOB: 1932-09-03 DOA: 04/05/2012 PCP: Ailene Ravel, MD  Brief narrative: 78 yo female with PMH significant for Parkinsons, afib, CAD, HTN, coagulopathy admitted for generalized weakness, confusion and  falls at home. Patient was found to have acute decompensated CHF.  Assessment and Plan:   Principal Problem:  *Acute decompensated diastolic and systolic CHF (congestive heart failure)  - BNP on this admission ~8,000 - 2 D ECHO on this admission with EF 25-30% indicative of progressive and worsening function based on last 2 D ECHO in May 2011 (obtained from [place] physician records) - continue daily weight monitoring and strict intake and output; weight 61.5 kg on admission and now 60.7 - I will try to obtain baseline dry weight from cardiology office - I will call cardiology to see if they agree with starting Lasix and continuing upon d/c - will have to strictly monitor kidney function as well  Active Problems: Bilateral lower extremities cellulitis  - continue vancomycin day #2 - clinically improving  Dehydration  - based on Hg/Hct and frankly perhaps not even true blood value and possible lab error - Hg stable this AM - CBC in AM  Acute on chronic renal failure  - creatinine is slightly up from baseline which could be secondary to lasix - will give 1 more dose of lasix, renal function may improve as patient has lost about 1 kg since admission - will follow closely  Parkinson's disease - continue sinemet  DVT prophylaxis - lovenox subQ  Danie Binder, MD  [facility] Pager [phone]  If 7PM-7AM, please contact night-coverage [email or web address] Password [email or web address] 04/06/2012, 4:57 PM   LOS: 1 day   Consultants:  Physical therapy   Procedures:  None  Antibiotics:  Vancomycin 09/04 -->  HPI/Subjective: No acute events overnight. Pt reports feeling better  Objective: Filed Vitals:   04/05/12 2214 04/06/12 0442  04/06/12 0750 04/06/12 1300  BP: 115/70 111/70 132/88 154/84  Pulse: 75 75 81 97  Temp: 97.4 F (36.3 C) 97.6 F (36.4 C) 97.4 F (36.3 C) 97.2 F (36.2 C)  TempSrc: Oral Oral Oral Oral  Resp: 16 16 18 18   Height:      Weight: 60.7 kg (133 lb 13.1 oz)     SpO2: 94% 91% 95% 95%    Intake/Output Summary (Last 24 hours) at 04/06/12 1657 Last data filed at 04/06/12 1422  Gross per 24 hour  Intake    580 ml  Output   1273 ml  Net   -693 ml    Exam:   General:  Pt is not in acute distress  Cardiovascular: paced rhythm, SEM appreciated  Respiratory: Clear to auscultation bilaterally, no wheezing, no crackles, no rhonchi  Abdomen: Soft, non tender, non distended, bowel sounds present, no guarding  Extremities: LE cellulitis and  + 2 bilateral lower extremity pitting edema, pulses DP and PT palpable bilaterally  Neuro: Grossly nonfocal  Data Reviewed: Basic Metabolic Panel:  Lab 04/06/12 4401 04/05/12 1751 04/05/12 1307 04/05/12 1238  NA 142 140 -- 139  K 4.4 5.3* -- 5.4*  CL 105 105 -- 104  CO2 28 25 -- 24  GLUCOSE 86 101* -- 99  BUN 41* 41* -- 43*  CREATININE 1.74* 1.65* -- 1.74*  CALCIUM 9.6 9.8 -- 10.4  MG -- -- 2.0 --  PHOS -- 3.4 -- --   Liver Function Tests:  Lab 04/05/12 1238  AST 29  ALT 13  ALKPHOS 143*  BILITOT 1.5*  PROT 6.7  ALBUMIN 4.1   CBC:  Lab 04/06/12 0605 04/05/12 1238  WBC 4.7 4.8  NEUTROABS -- 3.0  HGB 14.3 15.6*  HCT 42.2 46.3*  MCV 98.4 97.1  PLT 130* 122*   Cardiac Enzymes:  Lab 04/06/12 0605 04/05/12 2346 04/05/12 1749 04/05/12 1307  CKTOTAL -- -- -- --  CKMB -- -- -- --  TROPONINI <0.30 <0.30 <0.30 <0.30   CBG:  Lab 04/06/12 0831  GLUCAP 97     Studies: Dg Chest 2 View  04/05/2012  *RADIOLOGY REPORT*  Clinical Data: Shortness of breath, leg swelling  CHEST - 2 VIEW  Comparison: Chest x-ray of 08/31/2011  Findings: Significant cardiomegaly is again noted.  No focal infiltrate or effusion is seen.  There may be  minimal fullness of perihilar vasculature which may indicate mild fluid overload but no congestive heart failure is evident.  A permanent pacemaker is present.  The bones are osteopenic.  Lower thoracic and or upper lumbar vertebroplasties are noted.  IMPRESSION: Stable cardiomegaly with pacemaker.  Question mild fluid overload.   Original Report Authenticated By: Juline Patch, M.D.     Scheduled Meds:   . allopurinol  100 mg Oral Daily  . aMILoride  10 mg Oral Daily  . antiseptic oral rinse  15 mL Mouth Rinse q12n4p  . aspirin EC  81 mg Oral Daily  . carbidopa-levodopa  0.5 tablet Oral TID  . enoxaparin injection  30 mg Subcutaneous Q24H  . furosemide  40 mg Intravenous Once  . loperamide  2-4 mg Oral BID  . magnesium oxide  1,200 mg Oral BID  . metoprolol  50 mg Oral BID  . sodium polystyrene  15 g Oral Once  . vancomycin  500 mg Intravenous Q24H   Continuous Infusions:

## 2012-04-06 NOTE — Progress Notes (Signed)
  Echocardiogram 2D Echocardiogram has been performed.  Cloie Wooden 04/06/2012, 11:55 AM

## 2012-04-06 NOTE — Clinical Documentation Improvement (Signed)
CHF DOCUMENTATION CLARIFICATION QUERY  THIS DOCUMENT IS NOT A PERMANENT PART OF THE MEDICAL RECORD  TO RESPOND TO THE THIS QUERY, FOLLOW THE INSTRUCTIONS BELOW:  1. If needed, update documentation for the patient's encounter via the notes activity.  2. Access this query again and click edit on the In [employer].  3. After updating, or not, click F2 to complete all highlighted (required) fields concerning your review. Select "additional documentation in the medical record" OR "no additional documentation provided".  4. Click Sign note button.  5. The deficiency will fall out of your In Basket *Please let us know if you are not able to complete this workflow by phone or e-mail (listed below).  Please update your documentation within the medical record to reflect your response to this query.                                                                                    04/06/12  Dear Dr. Izola Price / Associates,  In a better effort to capture your patient's severity of illness, reflect appropriate length of stay and utilization of resources, a review of the patient medical record has revealed the following indicators the diagnosis of Heart Failure.    Based on your clinical judgment, please clarify TYPE & ACUITY and document in a progress note and/or discharge summary the clinical condition associated with the following supporting information:  In responding to this query please exercise your independent judgment.  The fact that a query is asked, does not imply that any particular answer is desired or expected.  Possible Clinical Conditions?  Acute Systolic Congestive Heart Failure Acute Diastolic Congestive Heart Failure Acute Systolic & Diastolic Congestive Heart Failure Acute on Chronic Systolic Congestive Heart Failure Acute on Chronic Diastolic Congestive Heart Failure Acute on Chronic Systolic & Diastolic  Congestive Heart Failure Chronic Diastolic Congestive Heart  Failure Chronic Systolic & Diastolic Congestive Heart Failure Other Condition Cannot Clinically Determine  Supporting Information:   Risk Factors: (As per notes) "Pt has SOB & lower extremity swelling & cellulitis"  Signs & Symptoms:(As per notes) "CHF exacerbation" & "CHF (congestive heart failure) - based on BNP and higher dry weight but per Hg value and clinically slightly hemoconcentrated - will follow up on daily weights, I's and O's - repeat BMP in AM - will give one dose of Lasix for now as pt has significant lower extremity edema      Diagnostics: BNP: 04-05-12 8496.0 (H)   Treatment:-(As per notes)" will give one dose of Lasix for now as pt has significant lower extremity edema"  Reviewed: additional documentation in the medical record  Thank You,  Joanette Gula Delk RN, BSN Clinical Documentation Specialist: [phone] Pager Health Information Management 

## 2012-04-07 DIAGNOSIS — I509 Heart failure, unspecified: Secondary | ICD-10-CM

## 2012-04-07 LAB — BASIC METABOLIC PANEL
Calcium: 9.6 mg/dL (ref 8.4–10.5)
Creatinine, Ser: 1.81 mg/dL — ABNORMAL HIGH (ref 0.50–1.10)
GFR calc Af Amer: 30 mL/min — ABNORMAL LOW (ref >90)

## 2012-04-07 LAB — CBC
MCH: 32.9 pg (ref 26.0–34.0)
MCV: 96.2 fL (ref 78.0–100.0)
Platelets: 111 10*3/uL — ABNORMAL LOW (ref 150–400)
RDW: 16.8 % — ABNORMAL HIGH (ref 11.5–15.5)
WBC: 5.4 10*3/uL (ref 4.0–10.5)

## 2012-04-07 NOTE — Progress Notes (Deleted)
   CARE MANAGEMENT NOTE 04/07/2012  Patient:  [name]   Account Number:  [id number]  Date Initiated:  04/03/2012  Documentation initiated by:  Darlyne Russian  Subjective/Objective Assessment:   Patient admitted with hemoptysis     Action/Plan:   Progression of care and discharge planning  09/06 Pt eligible for assistance with medications. Await prescriptions.   Anticipated DC Date:  04/07/2012   Anticipated DC Plan:  HOME/SELF CARE      DC Planning Services  CM consult      Choice offered to / List presented to:             Status of service:  Completed, signed off Medicare Important Message given?   (If response is "NO", the following Medicare IM given date fields will be blank) Date Medicare IM given:   Date Additional Medicare IM given:    Discharge Disposition:  HOME/SELF CARE  Per UR Regulation:    If discussed at Long Length of Stay Meetings, dates discussed:   04/06/2012    Comments:  04/07/2012 Prescriptions for Lantus and Cleocin po sent to pharmacy as assistance with medications. CRoyal RN MPH case manager [phone] Addem: noted request for [facility] social worker for substance abuse, this is not a service that is provided in the community for pt who are not homebound. HHRN also not available as pt is not homebound. Johny Shock RN MPH

## 2012-04-07 NOTE — Progress Notes (Signed)
Met with pt to discuss possible d/c needs, pt does not recall [facility] for CHF program, noted in hx that [facility] was set up in 2012, however would [facility] for CHF program be appropriate again? If so please order.  Johny Shock RN MPH Case Manager [phone]

## 2012-04-07 NOTE — Progress Notes (Addendum)
[facility] PROGRESS NOTE  [name] ZOX:[id number] DOB: [birth year] DOA: 04/05/2012 PCP: Ailene Ravel, MD  Brief narrative: 78 yo female with PMH significant for Parkinsons, afib, CAD, HTN, coagulopathy admitted for generalized weakness, confusion and falls at home. Patient was found to have acute decompensated CHF.   Assessment and Plan:   Principal Problem:  *Acute decompensated diastolic and systolic CHF (congestive heart failure)  - BNP on this admission ~8,000  - 2 D ECHO on this admission with EF 25-30% indicative of progressive and worsening function based on last 2 D ECHO in May 2011 (obtained from [facility])  - will continue daily weight monitoring and strict intake and output; weight 61.5 kg on admission and now appears to be able - I will try to obtain baseline dry weight from cardiology office  - I will call cardiology to see if they agree with starting Lasix and continuing upon d/c  - will have to strictly monitor kidney function as well   Active Problems:  Bilateral lower extremities cellulitis  - continue vancomycin day #3  - clinically improving   Dehydration  - hemoglobin stable at 13.9  Acute on chronic renal failure  - secondary to lasix - will follow closely   Parkinson's disease  - continue sinemet   DVT prophylaxis  - lovenox subQ   Consultants:  Physical therapy   Procedures:  2 D ECHO 09/04 - EF 25 - 30 %  Antibiotics:  Vancomycin 09/04 -->  Danie Binder, MD  [facility] Pager [phone]  If 7PM-7AM, please contact night-coverage [email or web address] Password [email or web address] 04/07/2012, 3:10 PM   LOS: 2 days   HPI/Subjective: No acute events overnight.  Objective: Filed Vitals:   04/06/12 1730 04/06/12 2134 04/07/12 0450 04/07/12 0951  BP: 145/76 121/77 124/77 120/74  Pulse: 97 105 75 75  Temp: 97.2 F (36.2 C) 97.5 F (36.4 C) 97.5 F (36.4 C) 98 F (36.7 C)  TempSrc: Oral Oral Oral Oral  Resp: 18 18 18  20   Height:      Weight:  61.6 kg (135 lb 12.9 oz)    SpO2: 96% 92% 92% 95%    Intake/Output Summary (Last 24 hours) at 04/07/12 1510 Last data filed at 04/07/12 1323  Gross per 24 hour  Intake    360 ml  Output   2300 ml  Net  -1940 ml    Exam:  General: Pt is not in acute distress  Cardiovascular: paced rhythm, SEM appreciated  Respiratory: Clear to auscultation bilaterally, no wheezing, no crackles, no rhonchi  Abdomen: Soft, non tender, non distended, bowel sounds present, no guarding  Extremities: LE cellulitis and + 2 bilateral lower extremity pitting edema, pulses DP and PT palpable bilaterally  Neuro: Grossly nonfocal  Data Reviewed: Basic Metabolic Panel:  Lab 04/07/12 8295 04/06/12 0605 04/05/12 1751 04/05/12 1238  NA 142 142 140 139  K 3.6 4.4 5.3* 5.4*  CL 100 105 105 104  CO2 30 28 25 24   GLUCOSE 87 86 101* 99  BUN 43* 41* 41* 43*  CREATININE 1.81* 1.74* 1.65* 1.74*  CALCIUM 9.6 9.6 9.8 10.4   Liver Function Tests:  Lab 04/05/12 1238  AST 29  ALT 13  ALKPHOS 143*  BILITOT 1.5*  PROT 6.7  ALBUMIN 4.1   CBC:  Lab 04/07/12 0605 04/06/12 0605 04/05/12 1238  WBC 5.4 4.7 4.8  HGB 13.9 14.3 15.6*  HCT 40.7 42.2 46.3*  MCV 96.2 98.4 97.1  PLT 111* 130* 122*  Cardiac Enzymes:  Lab 04/06/12 0605 04/05/12 2346 04/05/12 1749 04/05/12 1307  TROPONINI <0.30 <0.30 <0.30 <0.30   CBG:  Lab 04/06/12 0831  GLUCAP 97   Scheduled Meds:   . allopurinol  100 mg Oral Daily  . aMILoride  10 mg Oral Daily  . aspirin EC  81 mg Oral Daily  . carbidopa-levodopa  0.5 tablet Oral TID  . enoxaparin (LOVENOX)   30 mg Subcutaneous Q24H  . furosemide  40 mg Intravenous Daily  . loperamide  2-4 mg Oral BID  . magnesium oxide  1,200 mg Oral BID  . metoprolol  50 mg Oral BID  . vancomycin  500 mg Intravenous Q24H

## 2012-04-08 DIAGNOSIS — M129 Arthropathy, unspecified: Secondary | ICD-10-CM

## 2012-04-08 LAB — GLUCOSE, CAPILLARY: Glucose-Capillary: 88 mg/dL (ref 70–99)

## 2012-04-08 MED ORDER — SULFAMETHOXAZOLE-TMP DS 800-160 MG PO TABS
1.0000 | ORAL_TABLET | Freq: Two times a day (BID) | ORAL | Status: DC
  Filled 2012-04-08 (×2): qty 1

## 2012-04-08 MED ORDER — SULFAMETHOXAZOLE-TRIMETHOPRIM 400-80 MG PO TABS
1.0000 | ORAL_TABLET | Freq: Two times a day (BID) | ORAL | Status: DC
  Administered 2012-04-08 – 2012-04-12 (×9): 1 via ORAL
  Filled 2012-04-08 (×10): qty 1

## 2012-04-08 MED ORDER — FUROSEMIDE 40 MG PO TABS
40.0000 mg | ORAL_TABLET | Freq: Every day | ORAL | Status: DC
  Administered 2012-04-08 – 2012-04-10 (×3): 40 mg via ORAL
  Filled 2012-04-08 (×3): qty 1

## 2012-04-08 NOTE — Progress Notes (Signed)
Patient ID: [name], female   DOB: September 26, 1932, 78 y.o.   MRN: [id number]  [facility] PROGRESS NOTE  [name] JXB:[id number] DOB: 11-17-1932 DOA: 04/05/2012 PCP: Ailene Ravel, MD  Principal Problem:  *Acute decompensated diastolic and systolic CHF (congestive heart failure)  - BNP on this admission ~8,000  - 2 D ECHO on this admission with EF 25-30% indicative of progressive and worsening function based on last 2 D ECHO in May 2011 (obtained from [facility] records)  - will continue daily weight monitoring and strict intake and output; weight 61.5 kg on admission and now appears to be stable  - will transition to PO lasix and will continue to monitor clinical response  Active Problems:  Bilateral lower extremities cellulitis  - continue vancomycin day #4 but switch to oral Bactrim today - clinically improving   Dehydration  - hemoglobin stable and at pt's baseline - cbc in AM  Acute on chronic renal failure  - secondary to lasix  - will follow closely and will likely have to accept higher creatinine at the expense of diuresis   Parkinson's disease  - continue sinemet   DVT prophylaxis  - lovenox subQ   Consultants:  Physical therapy   Procedures:  2 D ECHO 09/04 - EF 25 - 30 %  Antibiotics:  Vancomycin 09/04 --> 09/07 Bactrim DS 09/07 -->  HPI/Subjective: No events overnight.   Objective: Filed Vitals:   04/08/12 0419 04/08/12 0926 04/08/12 0959 04/08/12 1300  BP: 108/58  109/71 113/74  Pulse: 75 86 74 82  Temp: 98.5 F (36.9 C)  98.1 F (36.7 C) 97.6 F (36.4 C)  TempSrc: Oral  Oral Oral  Resp: 18  18 18   Height:      Weight:      SpO2: 94% 96% 95% 96%    Intake/Output Summary (Last 24 hours) at 04/08/12 1346 Last data filed at 04/08/12 1300  Gross per 24 hour  Intake    800 ml  Output    275 ml  Net    525 ml    Exam:   General:  Pt is alert, follows commands appropriately, not in acute distress  Cardiovascular: Regular  rate and rhythm, S1/S2, no murmurs, no rubs, no gallops  Respiratory: Clear to auscultation bilaterally, no wheezing, no crackles, no rhonchi  Abdomen: Soft, non tender, non distended, bowel sounds present, no guarding  Extremities: Improving bilateral lower extremity pitting edema, pulses DP and PT palpable bilaterally  Neuro: Grossly nonfocal  Data Reviewed: Basic Metabolic Panel:  Lab 04/07/12 1308 04/06/12 0605 04/05/12 1751 04/05/12 1307 04/05/12 1238  NA 142 142 140 -- 139  K 3.6 4.4 5.3* -- 5.4*  CL 100 105 105 -- 104  CO2 30 28 25  -- 24  GLUCOSE 87 86 101* -- 99  BUN 43* 41* 41* -- 43*  CREATININE 1.81* 1.74* 1.65* -- 1.74*  CALCIUM 9.6 9.6 9.8 -- 10.4  MG -- -- -- 2.0 --  PHOS -- -- 3.4 -- --   Liver Function Tests:  Lab 04/05/12 1238  AST 29  ALT 13  ALKPHOS 143*  BILITOT 1.5*  PROT 6.7  ALBUMIN 4.1   No results found for this basename: LIPASE:5,AMYLASE:5 in the last 168 hours No results found for this basename: AMMONIA:5 in the last 168 hours CBC:  Lab 04/07/12 0605 04/06/12 0605 04/05/12 1238  WBC 5.4 4.7 4.8  NEUTROABS -- -- 3.0  HGB 13.9 14.3 15.6*  HCT 40.7 42.2 46.3*  MCV 96.2 98.4 97.1  PLT 111* 130* 122*   Cardiac Enzymes:  Lab 04/06/12 0605 04/05/12 2346 04/05/12 1749 04/05/12 1307  CKTOTAL -- -- -- --  CKMB -- -- -- --  CKMBINDEX -- -- -- --  TROPONINI <0.30 <0.30 <0.30 <0.30   BNP: No components found with this basename: POCBNP:5 CBG:  Lab 04/08/12 0757 04/06/12 0831  GLUCAP 88 97    No results found for this or any previous visit (from the past 240 hour(s)).   Scheduled Meds:   . allopurinol  100 mg Oral Daily  . aMILoride  10 mg Oral Daily  . antiseptic oral rinse  15 mL Mouth Rinse q12n4p  . aspirin EC  81 mg Oral Daily  . carbidopa-levodopa  0.5 tablet Oral TID  . enoxaparin (LOVENOX) injection  30 mg Subcutaneous Q24H  . furosemide  40 mg Intravenous Daily  . loperamide  2-4 mg Oral BID  . magnesium oxide  1,200 mg  Oral BID  . metoprolol  50 mg Oral BID  . sodium chloride  3 mL Intravenous Q12H  . sodium chloride  3 mL Intravenous Q12H  . vancomycin  500 mg Intravenous Q24H   Continuous Infusions:    Debbora Presto, MD  [facility] Pager [phone]  If 7PM-7AM, please contact night-coverage [email or web address] Password [email or web address] 04/08/2012, 1:46 PM   LOS: 3 days

## 2012-04-08 NOTE — Evaluation (Signed)
Physical Therapy Evaluation Patient Details Name: [name] MRN: [id number] DOB: 02/27/1933 Today's Date: 04/08/2012 Time: 1478-2956 PT Time Calculation (min): 36 min  PT Assessment / Plan / Recommendation Clinical Impression  Pt admitted with CHF and bil LE weakness, edema. Pt states she has thought about ALF but wants to stay at home a couple more years and hire someone to come help her. Pt states after her last hospitalization she had someone stay for 6-7wks with her during the day and she plans to do the same at discharge. Pt with decreased safety awareness and needs supervision for mobility at this time. Pt states she has a life alert at home and that dgtrs disagree with  her function. Pt will benefit from acute therapy to maximize gait, transfers and safety to increase independence at home.     PT Assessment  Patient needs continued PT services    Follow Up Recommendations  Home health PT;Supervision for mobility/OOB (recommend ALF but if pt hires a caregiver that is acceptable)    Barriers to Discharge Decreased caregiver support      Equipment Recommendations  None recommended by PT    Recommendations for Other Services     Frequency Min 3X/week    Precautions / Restrictions Precautions Precautions: Fall   Pertinent Vitals/Pain No pain sats 96% on RA      Mobility  Bed Mobility Bed Mobility: Supine to Sit Supine to Sit: With rails;HOB flat;6: Modified independent (Device/Increase time) Details for Bed Mobility Assistance: increased time  Transfers Transfers: Sit to Stand;Stand to Sit Sit to Stand: 5: Supervision;From bed;From toilet Stand to Sit: 5: Supervision;To toilet;To chair/3-in-1 Details for Transfer Assistance: cueing for hand placement and safety Ambulation/Gait Ambulation/Gait Assistance: 5: Supervision Ambulation Distance (Feet): 500 Feet Assistive device: Rolling walker Ambulation/Gait Assistance Details: cueing to step into RW and for posture to  extend trunk Gait Pattern: Step-through pattern;Decreased stride length;Trunk flexed Gait velocity: decreased Stairs: Yes Stairs Assistance: 5: Supervision Stairs Assistance Details (indicate cue type and reason): cueing for sequence Stair Management Technique: Backwards;With walker Number of Stairs: 1     Exercises     PT Diagnosis: Difficulty walking  PT Problem List: Decreased knowledge of use of DME;Decreased activity tolerance PT Treatment Interventions: Gait training;DME instruction;Functional mobility training;Therapeutic activities;Patient/family education   PT Goals Acute Rehab PT Goals PT Goal Formulation: With patient Time For Goal Achievement: 04/15/12 Potential to Achieve Goals: Good Pt will go Sit to Stand: with modified independence PT Goal: Sit to Stand - Progress: Goal set today Pt will go Stand to Sit: with modified independence PT Goal: Stand to Sit - Progress: Goal set today Pt will Ambulate: with modified independence;with least restrictive assistive device PT Goal: Ambulate - Progress: Goal set today Pt will Go Up / Down Stairs: 1-2 stairs;with modified independence;with least restrictive assistive device PT Goal: Up/Down Stairs - Progress: Goal set today  Visit Information  Last PT Received On: 04/08/12 Assistance Needed: +1    Subjective Data  Subjective: My dgtrs and I don't always agree with what I can do Patient Stated Goal: I want to go home and have someone come stay   Prior Functioning  Home Living Lives With: Alone Available Help at Discharge: Family;Available PRN/intermittently Type of Home: House Home Access: Stairs to enter [employer] of Steps: 2 Home Layout: One level Bathroom Shower/Tub: Walk-in shower;Door [employer] Toilet: Standard Home Adaptive Equipment: Clinical research associate - four wheeled;Bedside commode/3-in-1 Prior Function Level of Independence: Needs assistance Needs Assistance: Light Housekeeping  Light  Housekeeping: Moderate Able to Take Stairs?: Yes Driving: Yes (dgtrs don't want pt driving) Vocation: Retired Musician: No difficulties    Cognition  Overall Cognitive Status: Appears within functional limits for tasks assessed/performed Arousal/Alertness: Awake/alert Orientation Level: Time;Disoriented to Behavior During Session: [facility] for tasks performed    Extremity/Trunk Assessment Right Lower Extremity Assessment RLE ROM/Strength/Tone: [facility] for tasks assessed Left Lower Extremity Assessment LLE ROM/Strength/Tone: [facility] for tasks assessed Trunk Assessment Trunk Assessment: Kyphotic   Balance    End of Session PT - End of Session Activity Tolerance: Patient tolerated treatment well Patient left: in chair;with call bell/phone within reach;with chair alarm set Nurse Communication: Mobility status  GP     [name] 04/08/2012, 10:26 AM  [name], PT [phone]

## 2012-04-08 NOTE — Progress Notes (Signed)
Pharmacist Heart Failure Core Measure Documentation  Assessment: [name] has an EF documented as 25-30% on 04/06/12 by Dr. Izola Price.  Rationale: Heart failure patients with left ventricular systolic dysfunction (LVSD) and an EF < 40% should be prescribed an angiotensin converting enzyme inhibitor (ACEI) or angiotensin receptor blocker (ARB) at discharge unless a contraindication is documented in the medical record.  This patient is not currently on an ACEI or ARB for HF.  This note is being placed in the record in order to provide documentation that a contraindication to the use of these agents is present for this encounter.  ACE Inhibitor or Angiotensin Receptor Blocker is contraindicated (specify all that apply)  []   ACEI allergy AND ARB allergy []   Angioedema []   Moderate or severe aortic stenosis []   Hyperkalemia []   Hypotension []   Renal artery stenosis [x]   Worsening renal function, preexisting renal disease or dysfunction  Verlene Mayer, PharmD, BCPS Pager [phone]  04/08/2012 4:17 PM

## 2012-04-09 LAB — BASIC METABOLIC PANEL
GFR calc Af Amer: 32 mL/min — ABNORMAL LOW (ref >90)
GFR calc non Af Amer: 27 mL/min — ABNORMAL LOW (ref >90)
Potassium: 3.5 mEq/L (ref 3.5–5.1)
Sodium: 139 mEq/L (ref 135–145)

## 2012-04-09 LAB — CBC
MCHC: 34.3 g/dL (ref 30.0–36.0)
RDW: 16.6 % — ABNORMAL HIGH (ref 11.5–15.5)

## 2012-04-09 NOTE — Progress Notes (Addendum)
Patient ID: TRACYE SZUCH, female   DOB: 1933-01-19, 79 y.o.   MRN: [id number]  [facility] PROGRESS NOTE  ARYIAH MONTEROSSO UXL:[id number] DOB: Dec 10, 1932 DOA: 04/05/2012 PCP: Ailene Ravel, MD  Principal Problem:  *Acute decompensated diastolic and systolic CHF (congestive heart failure)  - BNP on this admission ~8,000  - 2 D ECHO on this admission with EF 25-30% indicative of progressive and worsening function based on last 2 D ECHO in May 2011 (obtained from [facility])  - will continue daily weight monitoring and strict intake and output; weight 61.5 kg on admission and now appears to be stable  - will continue PO lasix and will continue to monitor clinical response  Active Problems:  Bilateral lower extremities cellulitis  - continue vancomycin day #4 but switch to oral Bactrim today  - clinically improving  Dehydration  - hemoglobin stable and at pt's baseline  - cbc in AM  Acute on chronic renal failure  - secondary to lasix  - will follow closely and will likely have to accept higher creatinine at the expense of diuresis  Parkinson's disease  - continue sinemet  DVT prophylaxis  - lovenox subQ   Consultants:  Physical therapy  Procedures:  2 D ECHO 09/04 - EF 25 - 30 % Antibiotics:  Vancomycin 09/04 --> 09/07  Bactrim DS 09/07 -->  HPI/Subjective: No events overnight.   Objective: Filed Vitals:   04/08/12 2146 04/09/12 0538 04/09/12 0944 04/09/12 1300  BP: 108/63 122/76 120/74 107/67  Pulse: 75 74 75 75  Temp: 98.8 F (37.1 C) 98 F (36.7 C) 98.8 F (37.1 C) 97.9 F (36.6 C)  TempSrc: Oral Oral Oral Oral  Resp: 18 18 18 18   Height:      Weight: 56.3 kg (124 lb 1.9 oz)     SpO2: 92% 93% 95% 95%    Intake/Output Summary (Last 24 hours) at 04/09/12 1553 Last data filed at 04/09/12 1300  Gross per 24 hour  Intake    720 ml  Output    825 ml  Net   -105 ml    Exam:   General:  Pt is alert, follows commands appropriately, not in acute  distress  Cardiovascular: Regular rate and rhythm, S1/S2, no murmurs, no rubs, no gallops  Respiratory: Clear to auscultation bilaterally, no wheezing, no crackles, no rhonchi  Abdomen: Soft, non tender, non distended, bowel sounds present, no guarding  Extremities: +1 bilateral lower extremity edema, pulses DP and PT palpable bilaterally  Neuro: Grossly nonfocal  Data Reviewed: Basic Metabolic Panel:  Lab 04/09/12 5366 04/07/12 0605 04/06/12 0605 04/05/12 1751 04/05/12 1307 04/05/12 1238  NA 139 142 142 140 -- 139  K 3.5 3.6 4.4 5.3* -- 5.4*  CL 94* 100 105 105 -- 104  CO2 31 30 28 25  -- 24  GLUCOSE 89 87 86 101* -- 99  BUN 49* 43* 41* 41* -- 43*  CREATININE 1.73* 1.81* 1.74* 1.65* -- 1.74*  CALCIUM 9.4 9.6 9.6 9.8 -- 10.4  MG -- -- -- -- 2.0 --  PHOS -- -- -- 3.4 -- --   Liver Function Tests:  Lab 04/05/12 1238  AST 29  ALT 13  ALKPHOS 143*  BILITOT 1.5*  PROT 6.7  ALBUMIN 4.1   No results found for this basename: LIPASE:5,AMYLASE:5 in the last 168 hours No results found for this basename: AMMONIA:5 in the last 168 hours CBC:  Lab 04/09/12 0615 04/07/12 0605 04/06/12 0605 04/05/12 1238  WBC 5.6  5.4 4.7 4.8  NEUTROABS -- -- -- 3.0  HGB 14.3 13.9 14.3 15.6*  HCT 41.7 40.7 42.2 46.3*  MCV 95.6 96.2 98.4 97.1  PLT 104* 111* 130* 122*   Cardiac Enzymes:  Lab 04/06/12 0605 04/05/12 2346 04/05/12 1749 04/05/12 1307  CKTOTAL -- -- -- --  CKMB -- -- -- --  CKMBINDEX -- -- -- --  TROPONINI <0.30 <0.30 <0.30 <0.30   BNP: No components found with this basename: POCBNP:5 CBG:  Lab 04/09/12 0748 04/08/12 0757 04/06/12 0831  GLUCAP 88 88 97    No results found for this or any previous visit (from the past 240 hour(s)).   Scheduled Meds:   . allopurinol  100 mg Oral Daily  . aMILoride  10 mg Oral Daily  . antiseptic oral rinse  15 mL Mouth Rinse q12n4p  . aspirin EC  81 mg Oral Daily  . carbidopa-levodopa  0.5 tablet Oral TID  . enoxaparin (LOVENOX)  injection  30 mg Subcutaneous Q24H  . furosemide  40 mg Oral Daily  . loperamide  2-4 mg Oral BID  . magnesium oxide  1,200 mg Oral BID  . metoprolol  50 mg Oral BID  . sodium chloride  3 mL Intravenous Q12H  . sodium chloride  3 mL Intravenous Q12H  . sulfamethoxazole-trimethoprim  1 tablet Oral Q12H   Continuous Infusions:    Debbora Presto, MD  [facility] Pager [phone]  If 7PM-7AM, please contact night-coverage [email or web address] Password [email or web address] 04/09/2012, 3:53 PM   LOS: 4 days

## 2012-04-10 LAB — CBC
MCH: 33 pg (ref 26.0–34.0)
Platelets: 96 10*3/uL — ABNORMAL LOW (ref 150–400)
RBC: 4.42 MIL/uL (ref 3.87–5.11)
WBC: 4.8 10*3/uL (ref 4.0–10.5)

## 2012-04-10 LAB — BASIC METABOLIC PANEL
CO2: 34 mEq/L — ABNORMAL HIGH (ref 19–32)
Calcium: 9.2 mg/dL (ref 8.4–10.5)
GFR calc Af Amer: 30 mL/min — ABNORMAL LOW (ref >90)
Sodium: 139 mEq/L (ref 135–145)

## 2012-04-10 MED ORDER — FUROSEMIDE 20 MG PO TABS
20.0000 mg | ORAL_TABLET | Freq: Every day | ORAL | Status: DC
  Administered 2012-04-11 – 2012-04-12 (×2): 20 mg via ORAL
  Filled 2012-04-10 (×2): qty 1

## 2012-04-10 NOTE — Progress Notes (Signed)
Patient ID: [name], female   DOB: 1933/03/09, 78 y.o.   MRN: [id number]  [facility] PROGRESS NOTE  [name] WUJ:[id number] DOB: Sep 12, 1932 DOA: 04/05/2012 PCP: Ailene Ravel, MD  Brief narrative: Pt is 78 yo female with PMH significant for Parkinsons, afib, CAD, HTN, coagulopathy and who presents to ED with main concern of progressively worsening shortness of breath associated with generalized weakness, confusion, falls at home. This has initially started approximately 2 weeks ago and pt has also noted progressively worsening lower extremity swelling. Pt was admitted 04/05/2012 with CHF under [facility] service.  Principal Problem:  *Acute decompensated diastolic and systolic CHF (congestive heart failure)  - BNP on this admission ~8,000  - 2 D ECHO on this admission with EF 25-30% indicative of progressive and worsening function based on last 2 D ECHO in May 2011 (obtained from [facility] records)  - will continue daily weight monitoring and strict intake and output; weight 61.5 kg on admission and now appears to be stable  - will continue PO lasix and will continue to monitor clinical response  - will decrease the dose of Lasix as per Dr. Katrinka Blazing recommendation to 20 mg PO daily - BMP in AM  Active Problems:  Bilateral lower extremities cellulitis  - continue oral Bactrim - clinically improving   Dehydration  - hemoglobin stable and at pt's baseline  - now resolved and pt tolerating PO intake well - cbc in AM   Acute on chronic renal failure  - secondary to lasix  - will follow closely and will likely have to accept higher creatinine at the expense of diuresis  - likely new baseline Cr appears to be ~ 1.8  Parkinson's disease  - continue sinemet   DVT prophylaxis  - lovenox subQ   Consultants:  Physical therapy   Procedures:  2 D ECHO 09/04 - EF 25 - 30 %  Antibiotics:  Vancomycin 09/04 --> 09/07  Bactrim DS 09/07 -->   HPI/Subjective: No events  overnight.   Objective: Filed Vitals:   04/09/12 2102 04/10/12 0541 04/10/12 1030 04/10/12 1400  BP: 114/72 126/80 113/73 119/74  Pulse: 75 76 70 75  Temp: 97.6 F (36.4 C) 97.6 F (36.4 C) 97.4 F (36.3 C)   TempSrc: Oral Oral Oral   Resp: 18 18 20 20   Height:      Weight: 53.1 kg (117 lb 1 oz)     SpO2: 94% 91% 96% 97%    Intake/Output Summary (Last 24 hours) at 04/10/12 1828 Last data filed at 04/10/12 1500  Gross per 24 hour  Intake    840 ml  Output     90 ml  Net    750 ml    Exam:   General:  Pt is alert, follows commands appropriately, not in acute distress  Cardiovascular: Regular rate and rhythm, S1/S2, no murmurs, no rubs, no gallops  Respiratory: Clear to auscultation bilaterally, no wheezing, no crackles, no rhonchi  Abdomen: Soft, non tender, non distended, bowel sounds present, no guarding  Extremities: +1 lower extremity pitting edema, pulses DP and PT palpable bilaterally  Neuro: Grossly nonfocal  Data Reviewed: Basic Metabolic Panel:  Lab 04/10/12 9562 04/09/12 0615 04/07/12 0605 04/06/12 0605 04/05/12 1751 04/05/12 1307  NA 139 139 142 142 140 --  K 3.7 3.5 3.6 4.4 5.3* --  CL 94* 94* 100 105 105 --  CO2 34* 31 30 28 25  --  GLUCOSE 84 89 87 86 101* --  BUN  51* 49* 43* 41* 41* --  CREATININE 1.81* 1.73* 1.81* 1.74* 1.65* --  CALCIUM 9.2 9.4 9.6 9.6 9.8 --  MG -- -- -- -- -- 2.0  PHOS -- -- -- -- 3.4 --   Liver Function Tests:  Lab 04/05/12 1238  AST 29  ALT 13  ALKPHOS 143*  BILITOT 1.5*  PROT 6.7  ALBUMIN 4.1   No results found for this basename: LIPASE:5,AMYLASE:5 in the last 168 hours No results found for this basename: AMMONIA:5 in the last 168 hours CBC:  Lab 04/10/12 0700 04/09/12 0615 04/07/12 0605 04/06/12 0605 04/05/12 1238  WBC 4.8 5.6 5.4 4.7 4.8  NEUTROABS -- -- -- -- 3.0  HGB 14.6 14.3 13.9 14.3 15.6*  HCT 42.6 41.7 40.7 42.2 46.3*  MCV 96.4 95.6 96.2 98.4 97.1  PLT 96* 104* 111* 130* 122*   Cardiac  Enzymes:  Lab 04/06/12 0605 04/05/12 2346 04/05/12 1749 04/05/12 1307  CKTOTAL -- -- -- --  CKMB -- -- -- --  CKMBINDEX -- -- -- --  TROPONINI <0.30 <0.30 <0.30 <0.30   BNP: No components found with this basename: POCBNP:5 CBG:  Lab 04/09/12 0748 04/08/12 0757 04/06/12 0831  GLUCAP 88 88 97    No results found for this or any previous visit (from the past 240 hour(s)).   Scheduled Meds:   . allopurinol  100 mg Oral Daily  . aMILoride  10 mg Oral Daily  . antiseptic oral rinse  15 mL Mouth Rinse q12n4p  . aspirin EC  81 mg Oral Daily  . carbidopa-levodopa  0.5 tablet Oral TID  . enoxaparin (LOVENOX) injection  30 mg Subcutaneous Q24H  . furosemide  20 mg Oral Daily  . loperamide  2-4 mg Oral BID  . magnesium oxide  1,200 mg Oral BID  . metoprolol  50 mg Oral BID  . sodium chloride  3 mL Intravenous Q12H  . sodium chloride  3 mL Intravenous Q12H  . sulfamethoxazole-trimethoprim  1 tablet Oral Q12H  . DISCONTD: furosemide  40 mg Oral Daily   Continuous Infusions:    Debbora Presto, MD  [facility] Pager [phone]  If 7PM-7AM, please contact night-coverage [email or web address] Password [email or web address] 04/10/2012, 6:28 PM   LOS: 5 days

## 2012-04-10 NOTE — Progress Notes (Signed)
Met with pt re d/c plans, per pt her daughters and she do not always agree, and while she knows that they want the best for her, she wants to go home however is very vague about who would be with her. After last hospitalization, her niece stayed with her however that niece is now working. She speaks vaguely about neighbors who drop by, and she has used [facility] for [facility] and HHPT in the past. These options do not provide the daily assistance that she needs and pt remains alone for long periods. Pt gave permission for this CM to call her daughter, so a call was placed to Angie and message left, encouraging her and pt to discuss plans so that we can progress with this d/c plan.  Johny Shock RN MPH Case Manager [phone]

## 2012-04-11 LAB — BASIC METABOLIC PANEL
CO2: 33 mEq/L — ABNORMAL HIGH (ref 19–32)
Calcium: 9.3 mg/dL (ref 8.4–10.5)
Creatinine, Ser: 1.62 mg/dL — ABNORMAL HIGH (ref 0.50–1.10)
GFR calc non Af Amer: 30 mL/min — ABNORMAL LOW (ref >90)
Glucose, Bld: 87 mg/dL (ref 70–99)

## 2012-04-11 LAB — CBC
Hemoglobin: 13.6 g/dL (ref 12.0–15.0)
MCH: 32.9 pg (ref 26.0–34.0)
MCHC: 33.9 g/dL (ref 30.0–36.0)
MCV: 96.9 fL (ref 78.0–100.0)
Platelets: 91 10*3/uL — ABNORMAL LOW (ref 150–400)
RBC: 4.14 MIL/uL (ref 3.87–5.11)

## 2012-04-11 MED ORDER — FUROSEMIDE 20 MG PO TABS: 20.0000 mg | ORAL_TABLET | Freq: Every day | ORAL | Status: DC

## 2012-04-11 NOTE — Progress Notes (Signed)
Call placed to pt daughter, Karoline Caldwell 04/10/2012 and message with phone number left, however no return call received. Pt also stated that daughter would visit about 4:30pm however she did not arrive until much later. Per pt this morning, she states that daughter will speak with insurance rep re care in the home. This CM had this conversation with pt yesterday, as she stated that her insurance would pay for care in the home. However the insurance information provided to the hospital would pay for [facility] services such as HHRN, HHPT etc, this would not cover someone to stay in the home.  This morning CM placed calls to pt daughter cell phone and left message again requesting a return call and called work number however she had not yet arrived, will continue to try to reach her at her work number. Message also left on cell phone this morning.  I have explained to pt again that we need to make a plan and that her insurance agency can not arrange care in the home, the pt however is very confident that her insurance agent will be able to assist with this.  Johny Shock RN MPH Case Manager [phone]

## 2012-04-11 NOTE — Discharge Summary (Signed)
Physician Discharge Summary  [name] AVW:[id number] DOB: Jan 13, 1933 DOA: 04/05/2012  PCP: Ailene Ravel, MD  Admit date: 04/05/2012 Discharge date: 04/11/2012  Recommendations for Outpatient Follow-up:  1. Pt will need to follow up with PCP in 2-3 weeks post discharge 2. Please obtain BMP to evaluate electrolytes and kidney function 3. Please note that pt also needs to see cardiologist Dr. Verdis Prime and he was made aware of her hospitalization and the office will call for an appointment 4. Pt was started on Lasix 20 mg PO QD as recommended by cardiologist and that dose may need to be readjusted based on the symptoms 5. New baseline Cr appears to be ~ 1.6 - 1.8 6. Please also check CBC to evaluate Hg and Hct levels 7. Pt will be discharge 04/12/2012 to SNF of family and pt's choice  Discharge Diagnoses: Acute respiratory failure secondary to systolic and diastolic CHF Active Problems:  CHF (congestive heart failure)  Dehydration  Acute on chronic renal failure  Discharge Condition: Stable  Diet recommendation: Heart healthy diet discussed in details   Brief narrative:  Pt is 77 yo female with PMH significant for Parkinsons, afib, CAD, HTN, coagulopathy and who presents to ED with main concern of progressively worsening shortness of breath associated with generalized weakness, confusion, falls at home. This has initially started approximately 2 weeks ago and pt has also noted progressively worsening lower extremity swelling. Pt was admitted 04/05/2012 with CHF under [facility] service.   Principal Problem:  *Acute decompensated diastolic and systolic CHF (congestive heart failure)  - BNP on this admission ~8,000  - 2 D ECHO on this admission with EF 25-30% indicative of progressive and worsening function based on last 2 D ECHO in May 2011 (obtained from [facility] physician records)  - will continue daily weight monitoring and strict intake and output; weight 61.5 kg on admission and now  appears to be stable  - will continue PO lasix  - will decrease the dose of Lasix as per Dr. Katrinka Blazing recommendation to 20 mg PO daily  - new baseline Cr appears to be 1.6 - 1.8  Active Problems:  Bilateral lower extremities cellulitis  - continue oral Bactrim today and will d/c in AM - clinically resolved  Dehydration  - hemoglobin stable and at pt's baseline  - now resolved and pt tolerating PO intake well   Acute on chronic renal failure  - secondary to lasix  - will follow closely and will likely have to accept higher creatinine at the expense of diuresis  - likely new baseline Cr appears to be ~ 1.6 - 1.8  Parkinson's disease  - continue sinemet   DVT prophylaxis  - lovenox subQ   Consultants:  Physical therapy   Procedures:  2 D ECHO 09/04 - EF 25 - 30 %  Antibiotics:  Vancomycin 09/04 --> 09/07  Bactrim DS 09/07 --> 09/11  Discharge Exam: Filed Vitals:   04/11/12 1357  BP: 112/68  Pulse: 78  Temp: 98.1 F (36.7 C)  Resp: 18   Filed Vitals:   04/11/12 0753 04/11/12 0900 04/11/12 0935 04/11/12 1357  BP: 107/65 131/84 102/58 112/68  Pulse: 76 81  78  Temp: 97.7 F (36.5 C) 98.5 F (36.9 C)  98.1 F (36.7 C)  TempSrc: Oral Oral    Resp: 18 18  18   Height:      Weight:      SpO2: 95% 97%  98%    General: Pt is alert, follows commands  appropriately, not in acute distress Cardiovascular: Regular rate and rhythm, S1/S2 +, no murmurs, no rubs, no gallops Respiratory: Clear to auscultation bilaterally, no wheezing, no crackles, no rhonchi Abdominal: Soft, non tender, non distended, bowel sounds +, no guarding Extremities: no edema, no cyanosis, pulses palpable bilaterally DP and PT Neuro: Grossly nonfocal  Discharge Instructions   Medication List  As of 04/11/2012  5:02 PM   TAKE these medications         acetaminophen 325 MG tablet   Commonly known as: TYLENOL   Take 162 mg by mouth daily.      allopurinol 100 MG tablet   Commonly known as:  ZYLOPRIM   Take 100 mg by mouth daily.      aMILoride 5 MG tablet   Commonly known as: MIDAMOR   Take 10 mg by mouth daily.      aspirin EC 81 MG tablet   Take 81 mg by mouth daily.      carbidopa-levodopa 25-100 MG per tablet   Commonly known as: SINEMET IR   Take 0.5 tablets by mouth 3 (three) times daily.      furosemide 20 MG tablet   Commonly known as: LASIX   Take 1 tablet (20 mg total) by mouth daily.      loperamide 2 MG tablet   Commonly known as: IMODIUM A-D   Take 2-4 mg by mouth 2 (two) times daily. 4mg  in am, 2mg  in pm      magnesium oxide 400 MG tablet   Commonly known as: MAG-OX   Take 1,200 mg by mouth 2 (two) times daily.      metoprolol 50 MG tablet   Commonly known as: LOPRESSOR   Take 50 mg by mouth 2 (two) times daily.      OVER THE COUNTER MEDICATION   Take 1 tablet by mouth daily. Calcium           Follow-up Information    Follow up with [facility], MD in 2 weeks.   Contact information:   Dr. Burnell Blanks [address] [place] [address] [phone]       Follow up with Lesleigh Noe, MD in 1 week.   Contact information:   [address] [place] [address] [place] [address] [phone]           The results of significant diagnostics from this hospitalization (including imaging, microbiology, ancillary and laboratory) are listed below for reference.     Microbiology: No results found for this or any previous visit (from the past 240 hour(s)).   Labs: Basic Metabolic Panel:  Lab 04/11/12 6962 04/10/12 0700 04/09/12 0615 04/07/12 0605 04/06/12 0605 04/05/12 1751 04/05/12 1307  NA 138 139 139 142 142 -- --  K 3.6 3.7 3.5 3.6 4.4 -- --  CL 96 94* 94* 100 105 -- --  CO2 33* 34* 31 30 28  -- --  GLUCOSE 87 84 89 87 86 -- --  BUN 44* 51* 49* 43* 41* -- --  CREATININE 1.62* 1.81* 1.73* 1.81* 1.74* -- --  CALCIUM 9.3 9.2 9.4 9.6 9.6 -- --  MG -- -- -- -- -- -- 2.0  PHOS -- -- --  -- -- 3.4 --   Liver Function Tests:  Lab 04/05/12 1238  AST 29  ALT 13  ALKPHOS 143*  BILITOT 1.5*  PROT 6.7  ALBUMIN 4.1   CBC:  Lab 04/11/12 0624 04/10/12 0700 04/09/12 0615 04/07/12 9528 04/06/12 4132  04/05/12 1238  WBC 4.4 4.8 5.6 5.4 4.7 --  NEUTROABS -- -- -- -- -- 3.0  HGB 13.6 14.6 14.3 13.9 14.3 --  HCT 40.1 42.6 41.7 40.7 42.2 --  MCV 96.9 96.4 95.6 96.2 98.4 --  PLT 91* 96* 104* 111* 130* --   Cardiac Enzymes:  Lab 04/06/12 0605 04/05/12 2346 04/05/12 1749 04/05/12 1307  CKTOTAL -- -- -- --  CKMB -- -- -- --  CKMBINDEX -- -- -- --  TROPONINI <0.30 <0.30 <0.30 <0.30   BNP: BNP (last 3 results)  Basename 04/05/12 1307  PROBNP 8496.0*   CBG:  Lab 04/09/12 0748 04/08/12 0757 04/06/12 0831  GLUCAP 88 88 97     SIGNED: Time coordinating discharge: Over 30 minutes  Debbora Presto, MD  [facility] 04/11/2012, 5:02 PM Pager [phone]  If 7PM-7AM, please contact night-coverage [email or web address] Password [email or web address]

## 2012-04-11 NOTE — Clinical Social Work Psychosocial (Signed)
     Clinical Social Work Department BRIEF PSYCHOSOCIAL ASSESSMENT 04/11/2012  Patient:  [name], [name]     Account Number:  [id number]     Admit date:  04/05/2012  Clinical Social Worker:  Delmer Islam  Date/Time:  04/11/2012 04:26 AM  Referred by:  Physician  Date Referred:  04/10/2012 Referred for  SNF Placement   Other Referral:   Interview type:  Patient Other interview type:   Also talked with daughters [name] and [name] by phone.    PSYCHOSOCIAL DATA Living Status:  ALONE Admitted from facility:   Level of care:   Primary support name:  [name] Primary support relationship to patient:  CHILD, ADULT Degree of support available:   Daughter [name]. Both daughters concerned and supportive.  Phone number Ms. Hickman [phone] (cell), [phone] (work)    CURRENT CONCERNS Current Concerns  Post-Acute Placement   Other Concerns:    SOCIAL WORK ASSESSMENT / PLAN CSW talked with patient about discharge planning and MD/PT recommendation of short term rehab. Patient was in agreement and informed CSW that her sister is currently a resident of [facility], and that she would like to go there.    CSW talked with both daughters by phone and they are in agreement with their mother going to rehab. The daughters preferences are [facility] and [facility] in [place].    CSW informed the patient and Ms. Hickman that they would be kept informed of facility responses.   Assessment/plan status:  Psychosocial Support/Ongoing Assessment of Needs Other assessment/ plan:   Information/referral to community resources:   Skilled Optometrist given to patient.    PATIENTS/FAMILYS RESPONSE TO PLAN OF CARE: The patient and daughters were appreciated of the resources provided by CSW.

## 2012-04-11 NOTE — Clinical Social Work Placement (Addendum)
    Clinical Social Work Department CLINICAL SOCIAL WORK PLACEMENT NOTE 04/11/2012  Patient:  RYONNA, CIMINI  Account Number:  [id number] Admit date:  04/05/2012  Clinical Social Worker:  Genelle Bal, LCSW  Date/time:  04/11/2012 04:50 AM  Clinical Social Work is seeking post-discharge placement for this patient at the following level of care:   SKILLED NURSING   (*CSW will update this form in Epic as items are completed)   04/11/2012  Patient/family provided with [facility] Department of Clinical Social Work's list of facilities offering this level of care within the geographic area requested by the patient (or if unable, by the patient's family).  04/11/2012  Patient/family informed of their freedom to choose among providers that offer the needed level of care, that participate in Medicare, Medicaid or managed care program needed by the patient, have an available bed and are willing to accept the patient.    Patient/family informed of [facility]' ownership interest in [facility], as well as of the fact that they are under no obligation to receive care at this facility.  PASARR submitted to EDS on 04/11/2012 PASARR number received from EDS on 04/11/2012  FL2 transmitted to all facilities in geographic area requested by pt/family on  04/11/2012 FL2 transmitted to all facilities within larger geographic area on   Patient informed that his/her managed care company has contracts with or will negotiate with  certain facilities, including the following:     Patient/family informed of bed offers received: 04/11/12  Patient chooses bed at  Physician recommends and patient chooses bed at    Patient to be transferred to home on 04/12/12  Patient to be transferred to facility by   The following physician request were entered in Epic:   Additional Comments: **See 9/11 progress note

## 2012-04-11 NOTE — Progress Notes (Signed)
Physical Therapy Treatment Patient Details Name: [name] MRN: [id number] DOB: 03/21/1933 Today's Date: 04/11/2012 Time: 4098-1191 PT Time Calculation (min): 29 min  PT Assessment / Plan / Recommendation Comments on Treatment Session  Berg Balance Assessment score of 41/56 indicates significantly high fall risk, and can indicate SNF is appropriate for rehab for balance and steadiness training, especially as pt lives alone, and will likely be alone for large portions of the day; Case Mgr and SW notified    Follow Up Recommendations  Skilled nursing facility    Barriers to Discharge        Equipment Recommendations  None recommended by PT    Recommendations for Other Services OT consult  Frequency Min 3X/week   Plan Discharge plan needs to be updated    Precautions / Restrictions Precautions Precautions: Fall   Pertinent Vitals/Pain no apparent distress     Mobility  Bed Mobility Bed Mobility: Supine to Sit Supine to Sit: With rails;HOB flat;6: Modified independent (Device/Increase time) Details for Bed Mobility Assistance: increased time  Transfers Transfers: Sit to Stand;Stand to Sit Sit to Stand: 5: Supervision;From bed;From toilet Stand to Sit: 5: Supervision;To toilet;To chair/3-in-1 Details for Transfer Assistance: cueing for hand placement and safety; reinforced to pt to call for assist when gettign up Ambulation/Gait Ambulation/Gait Assistance: 4: Min assist;5: Supervision Ambulation Distance (Feet): 400 Feet Assistive device: Rolling walker;1 person hand held assist Ambulation/Gait Assistance Details: Pt reports she typically uses a straight cane at home -- gave unilateral handheld assist to approximate cane, and pt quite unsteady, reaching out for bil UE support, and noted incr UE tremor with the incr effort of amb with unilateral support; Much improved with pushing wheelchair (simulating RW) for bil UE support Gait Pattern: Step-through pattern;Decreased  stride length;Trunk flexed Gait velocity: decreased    Exercises     PT Diagnosis:    PT Problem List:   PT Treatment Interventions:     PT Goals Acute Rehab PT Goals Time For Goal Achievement: 04/15/12 Potential to Achieve Goals: Good Pt will go Sit to Stand: with modified independence PT Goal: Sit to Stand - Progress: Progressing toward goal Pt will go Stand to Sit: with modified independence PT Goal: Stand to Sit - Progress: Progressing toward goal Pt will Ambulate: with modified independence;with least restrictive assistive device PT Goal: Ambulate - Progress: Progressing toward goal Additional Goals Additional Goal #1: Will score greater than or equal to 49/56 on Berg indicating decr risk of falls PT Goal: Additional Goal #1 - Progress: Goal set today  Visit Information  Last PT Received On: 04/11/12 Assistance Needed: +1    Subjective Data  Subjective: Seems agreeable to considering SNF for rehab   Cognition  Overall Cognitive Status: Appears within functional limits for tasks assessed/performed Arousal/Alertness: Awake/alert Orientation Level: Appears intact for tasks assessed Behavior During Session: [facility] for tasks performed    Balance  Standardized Balance Assessment Standardized Balance Assessment: Berg Balance Test Berg Balance Test Sit to Stand: Able to stand without using hands and stabilize independently Standing Unsupported: Able to stand safely 2 minutes Sitting with Back Unsupported but Feet Supported on Floor or Stool: Able to sit safely and securely 2 minutes Stand to Sit: Controls descent by using hands Transfers: Able to transfer safely, definite need of hands Standing Unsupported with Eyes Closed: Able to stand 10 seconds with supervision Standing Ubsupported with Feet Together: Able to place feet together independently and stand for 1 minute with supervision From Standing, Reach Forward with Outstretched  Arm: Can reach forward >12 cm safely  (5") From Standing Position, Pick up Object from Floor: Able to pick up shoe, needs supervision From Standing Position, Turn to Look Behind Over each Shoulder: Looks behind one side only/other side shows less weight shift Turn 360 Degrees: Able to turn 360 degrees safely but slowly Standing Unsupported, Alternately Place Feet on Step/Stool: Able to complete >2 steps/needs minimal assist Standing Unsupported, One Foot in Front: Able to plae foot ahead of the other independently and hold 30 seconds Standing on One Leg: Able to lift leg independently and hold equal to or more than 3 seconds Total Score: 41   End of Session PT - End of Session Equipment Utilized During Treatment: Gait belt Activity Tolerance: Patient tolerated treatment well Patient left: in chair;with call bell/phone within reach;with chair alarm set   GP     Van Clines [facility] [place], [place] [phone]  04/11/2012, 1:32 PM

## 2012-04-12 DIAGNOSIS — G709 Myoneural disorder, unspecified: Secondary | ICD-10-CM

## 2012-04-12 DIAGNOSIS — I10 Essential (primary) hypertension: Secondary | ICD-10-CM

## 2012-04-12 MED ORDER — FUROSEMIDE 20 MG PO TABS: 20.0000 mg | ORAL_TABLET | Freq: Every day | ORAL | Status: DC

## 2012-04-12 NOTE — Progress Notes (Signed)
Patient briefly seen and examined. Informed by Dr. Izola Price that patient would be ready for DC SNF today. Discharge Summary has been done dated 04/11/12. Discharge was delayed an extra day for SNF placement issues. No changes to hospital course or discharge meds.  Peggye Pitt, MD [facility] Pager: [phone]

## 2012-04-12 NOTE — Progress Notes (Signed)
Patient has been discharged home with daughter. Patient was discharged with discharge paperwork and education on heart failure. Daughter needed the pharmacy changed for prescription pick up. Dr Ardyth Harps was contacted and pharmacy was changed. Patient was stable upon discharge.

## 2012-04-12 NOTE — Progress Notes (Signed)
   CARE MANAGEMENT NOTE 04/12/2012  Patient:  [name], LANGLINAIS   Account Number:  [id number]  Date Initiated:  04/07/2012  Documentation initiated by:  Edessa Jakubowicz  Subjective/Objective Assessment:   Met with pt to d/c possible d/c needs, noted that pt has hx of CHF, however she states that she has not been in a CHF [facility] program.     Action/Plan:   MD please order [facility] for CHF program.  Noted that pt was referred to [facility] on a past hospitalization for CHF program.   Anticipated DC Date:  04/12/2012   Anticipated DC Plan:  HOME W HOME HEALTH SERVICES         Choice offered to / List presented to:          [facility] arranged  HH-1 RN  HH-2 PT      [facility] agency  [facility] Care   Status of service:  Completed, signed off Medicare Important Message given?   (If response is "NO", the following Medicare IM given date fields will be blank) Date Medicare IM given:   Date Additional Medicare IM given:    Discharge Disposition:  HOME W HOME HEALTH SERVICES  Per UR Regulation:    If discussed at Long Length of Stay Meetings, dates discussed:   04/11/2012    Comments:  04/12/2012 Pt for d/c to home with family to arrange assistance in the home so that pt has 24/7 supervision if possible. Family declined SNF offers as they were unable to obtain placement at preferred facilities due to lack of networking with pt insurance. Spoke with pt and pt daughter, Frederich Chick who wished to use [facility] as pt has worked with that [facility] agency previously. Call placed to that agency and all info faxed ( orders, face to face, demographics, and d/c summary).  Daughter ask that she be contacted re appointments as her mother is sometimes sl confused. HH agency notified. Johny Shock RN MPH Case Manager [phone]

## 2012-04-12 NOTE — Clinical Social Work Note (Signed)
Patient/family facility preference is [facility]. CSW talked with Gregary Signs, admissions director today regarding patient discharge. Insurance submitted for pre-authorization, however per Ms. Berneda Rose, pt does not have a skilled need as she walked 500 ft and there was no OT eval. She does not think insurance will approve but even if it does they will not accept her as no skilled need. After more discussion, Ms. Berneda Rose will advise CSW of insurance decision and she will call daughter and explain why they will not accept her.  CSW talked with daughter later to determine if they will consider another facility and to advise her that [facility] not in network with patientis insurance. Ms. Oneida Alar stated that patient will go home with [facility] services and they will hire someone to come in to assist patient. Daughter advised that she will come to hospital after she gets off work to take patient home. RN case Production designer, theatre/television/film notified. CSW signing off as no other CSW needs.  Genelle Bal, MSW, LCSW [phone]

## 2012-06-19 DIAGNOSIS — G20A1 Parkinson's disease without dyskinesia, without mention of fluctuations: Secondary | ICD-10-CM | POA: Insufficient documentation

## 2012-06-19 DIAGNOSIS — G20 Parkinson's disease: Secondary | ICD-10-CM | POA: Insufficient documentation

## 2012-06-19 DIAGNOSIS — R42 Dizziness and giddiness: Secondary | ICD-10-CM | POA: Insufficient documentation

## 2012-06-19 DIAGNOSIS — R269 Unspecified abnormalities of gait and mobility: Secondary | ICD-10-CM | POA: Insufficient documentation

## 2012-07-20 ENCOUNTER — Other Ambulatory Visit (HOSPITAL_COMMUNITY): Payer: Self-pay | Admitting: Internal Medicine

## 2012-12-19 ENCOUNTER — Encounter: Payer: Self-pay | Admitting: Neurology

## 2012-12-19 ENCOUNTER — Ambulatory Visit: Payer: Self-pay | Admitting: Neurology

## 2012-12-19 DIAGNOSIS — G20 Parkinson's disease: Secondary | ICD-10-CM

## 2012-12-19 DIAGNOSIS — G20A1 Parkinson's disease without dyskinesia, without mention of fluctuations: Secondary | ICD-10-CM

## 2012-12-19 DIAGNOSIS — R42 Dizziness and giddiness: Secondary | ICD-10-CM

## 2012-12-19 DIAGNOSIS — R269 Unspecified abnormalities of gait and mobility: Secondary | ICD-10-CM

## 2013-06-19 ENCOUNTER — Encounter: Payer: Self-pay | Admitting: Internal Medicine

## 2013-06-19 DIAGNOSIS — I4891 Unspecified atrial fibrillation: Secondary | ICD-10-CM

## 2013-07-24 ENCOUNTER — Encounter: Payer: Self-pay | Admitting: Internal Medicine

## 2013-07-24 ENCOUNTER — Telehealth: Payer: Self-pay

## 2013-07-25 MED ORDER — AMILORIDE HCL 5 MG PO TABS: 5.0000 mg | ORAL_TABLET | Freq: Every day | ORAL | Status: DC

## 2013-07-25 NOTE — Telephone Encounter (Signed)
done

## 2013-08-01 ENCOUNTER — Encounter: Payer: Self-pay | Admitting: Internal Medicine

## 2013-08-13 ENCOUNTER — Encounter: Payer: Self-pay | Admitting: Internal Medicine

## 2013-08-13 ENCOUNTER — Ambulatory Visit (INDEPENDENT_AMBULATORY_CARE_PROVIDER_SITE_OTHER): Payer: Medicare Other | Admitting: Internal Medicine

## 2013-08-13 ENCOUNTER — Encounter (INDEPENDENT_AMBULATORY_CARE_PROVIDER_SITE_OTHER): Payer: Self-pay

## 2013-08-13 VITALS — BP 99/63 | HR 75 | Ht 60.0 in | Wt 120.0 lb

## 2013-08-13 DIAGNOSIS — I4891 Unspecified atrial fibrillation: Secondary | ICD-10-CM

## 2013-08-13 DIAGNOSIS — I442 Atrioventricular block, complete: Secondary | ICD-10-CM

## 2013-08-13 DIAGNOSIS — I509 Heart failure, unspecified: Secondary | ICD-10-CM

## 2013-08-13 DIAGNOSIS — I2510 Atherosclerotic heart disease of native coronary artery without angina pectoris: Secondary | ICD-10-CM

## 2013-08-13 DIAGNOSIS — Z950 Presence of cardiac pacemaker: Secondary | ICD-10-CM

## 2013-08-13 LAB — MDC_IDC_ENUM_SESS_TYPE_INCLINIC
Implantable Pulse Generator Serial Number: 777915
Lead Channel Impedance Value: 399 Ohm
Lead Channel Pacing Threshold Amplitude: 1 V
Lead Channel Setting Sensing Sensitivity: 4 mV
MDC IDC MSMT BATTERY IMPEDANCE: 3800 Ohm
MDC IDC MSMT BATTERY VOLTAGE: 2.74 V
MDC IDC MSMT LEADCHNL RV PACING THRESHOLD PULSEWIDTH: 0.4 ms
MDC IDC SESS DTM: 20150112111013
MDC IDC SET LEADCHNL RV PACING PULSEWIDTH: 0.4 ms
MDC IDC STAT BRADY RV PERCENT PACED: 99 %

## 2013-08-13 NOTE — Assessment & Plan Note (Signed)
As above.

## 2013-08-13 NOTE — Assessment & Plan Note (Signed)
He is device dependent. He is stable status post AV ablation. Heart rate excursion is reasonable. She is programmed at 75 beats per minute

## 2013-08-13 NOTE — Progress Notes (Signed)
Patient Care Team: Ailene Ravel, MD as PCP - General (Family Medicine)   HPI  DEVITA NIES is a 80 y.o. female Seen in followup for pacer implanted years ago with generator replacement 2004  Has permanent atrial fibrillation status post AV junction ablation. Her dual chamber device was replaced at her last procedure was a single chamber device.   Echocardiogram 9/13 demonstrated an ejection fraction 25-30%.; This is a significant interval decrease in at 03/12 her ejection fraction was 50%.  She has marked limitations in exercise tolerance. In part this relates to fatigue the following day. She denies edema. He does have dyspnea on exertion This could be partly related to Parkinson's.    Past Medical History  Diagnosis Date  . Coronary artery disease   . Hypertension   . Heart murmur   . Shortness of breath   . Dysrhythmia     atrial fib  . Arthritis   . Neuromuscular disorder     parkinsons  . CHF (congestive heart failure)   . Atrial fibrillation   . Gout   . Osteoporosis   . Hypomagnesemia     Hx.  . Mild memory disturbance   . Peptic ulcer disease   . Degenerative arthritis   . Thyroid goiter     resection    Past Surgical History  Procedure Laterality Date  . Pacemaker placement    . Insert / replace / remove pacemaker    . Abdominal hysterectomy    . Cholecystectomy    . Toe nails      removed  . Bladder tack    . Appendectomy    . Tonsillectomy    . Kyphoplasty      Compression fracture, lumbosacral spine  . Rectocele repair    . Cataract extraction, bilateral    . Hiatal hernia repair      Current Outpatient Prescriptions  Medication Sig Dispense Refill  . acetaminophen (TYLENOL) 325 MG tablet Take 162 mg by mouth daily.      Marland Kitchen allopurinol (ZYLOPRIM) 100 MG tablet Take 100 mg by mouth daily.      Marland Kitchen aMILoride (MIDAMOR) 5 MG tablet Take 1 tablet (5 mg total) by mouth daily.  30 tablet  10  . aspirin EC 81 MG tablet Take 81 mg by mouth  daily.      . carbidopa-levodopa (SINEMET) 25-100 MG per tablet Take 0.5 tablets by mouth 3 (three) times daily.      Marland Kitchen loperamide (IMODIUM A-D) 2 MG tablet Take 2-4 mg by mouth 2 (two) times daily. 4mg  in am, 2mg  in pm      . magnesium oxide (MAG-OX) 400 MG tablet Take 1,200 mg by mouth 2 (two) times daily.      . metoprolol (LOPRESSOR) 50 MG tablet Take 50 mg by mouth 2 (two) times daily.      Marland Kitchen OVER THE COUNTER MEDICATION Take 1 tablet by mouth daily. Calcium      . furosemide (LASIX) 20 MG tablet Take 1 tablet (20 mg total) by mouth daily.  30 tablet  1   No current facility-administered medications for this visit.    No Known Allergies  Review of Systems negative except from HPI and PMH  Physical Exam BP 99/63  Pulse 75  Ht 5' (1.524 m)  Wt 120 lb (54.432 kg)  BMI 23.44 kg/m2 Well developed and cachectic in no acute distress HENT normal E scleral and icterus clear Neck Supple JVP  flat; carotids brisk and full Clear to ausculation  Kyphosis Regular rate and rhythm, no murmurs gallops or rub Soft with active bowel sounds No clubbing cyanosis Trace Edema Alert and oriented, grossly normal motor and sensory function Skin Warm and Dry  ECG atrial fibrillation with ventricular pacing at 75 per minute Mrs. Verlon AuLeslie  Assessment and  Plan\

## 2013-08-13 NOTE — Assessment & Plan Note (Signed)
I wonder whether her heart failure and cardiomyopathy is related to pacing either RV apical pacing or more rapid pacing. Her pacing has been present however for many many years an ejection fraction deterioration has been more recent. She carries a diagnosis of coronary artery disease. I this is potentially contributing. Our review this with Dr. Katrinka BlazingSmith

## 2013-08-13 NOTE — Assessment & Plan Note (Signed)
Permanent. Not on anticoagulation rather low-dose aspirin defer to primary cardiology

## 2013-08-13 NOTE — Patient Instructions (Signed)
Your physician recommends that you continue on your current medications as directed. Please refer to the Current Medication list given to you today.  Your physician wants you to follow-up in: 1 year with Dr. Klein.  You will receive a reminder letter in the mail two months in advance. If you don't receive a letter, please call our office to schedule the follow-up appointment.  

## 2013-08-17 ENCOUNTER — Encounter: Payer: Self-pay | Admitting: Internal Medicine

## 2013-08-24 ENCOUNTER — Other Ambulatory Visit: Payer: Self-pay | Admitting: Neurology

## 2013-08-27 NOTE — Telephone Encounter (Signed)
No Showed Last Appt

## 2013-08-30 ENCOUNTER — Other Ambulatory Visit: Payer: Self-pay | Admitting: Neurology

## 2013-08-31 NOTE — Telephone Encounter (Signed)
Patient No Showed last appt.  I called her to ask that she reschedule.  She verbalized understanding, but then said she was having issues with her phone.  I tried to call her back twice and the line was busy each time.

## 2013-10-04 ENCOUNTER — Other Ambulatory Visit: Payer: Self-pay

## 2013-10-04 MED ORDER — AMILORIDE HCL 5 MG PO TABS: 5.0000 mg | ORAL_TABLET | Freq: Every day | ORAL | Status: DC

## 2013-10-05 ENCOUNTER — Other Ambulatory Visit: Payer: Self-pay | Admitting: Neurology

## 2013-10-05 NOTE — Telephone Encounter (Signed)
Patient has an appt in July  

## 2013-10-10 ENCOUNTER — Encounter: Payer: Self-pay | Admitting: Internal Medicine

## 2013-10-22 ENCOUNTER — Encounter: Payer: Self-pay | Admitting: Internal Medicine

## 2014-01-21 ENCOUNTER — Encounter: Payer: Self-pay | Admitting: Internal Medicine

## 2014-01-21 DIAGNOSIS — I4891 Unspecified atrial fibrillation: Secondary | ICD-10-CM

## 2014-02-15 ENCOUNTER — Encounter: Payer: Self-pay | Admitting: Neurology

## 2014-02-15 ENCOUNTER — Ambulatory Visit (INDEPENDENT_AMBULATORY_CARE_PROVIDER_SITE_OTHER): Payer: Medicare Other | Admitting: Neurology

## 2014-02-15 VITALS — BP 146/84 | HR 66 | Wt 113.0 lb

## 2014-02-15 DIAGNOSIS — R269 Unspecified abnormalities of gait and mobility: Secondary | ICD-10-CM

## 2014-02-15 DIAGNOSIS — G20 Parkinson's disease: Secondary | ICD-10-CM

## 2014-02-15 MED ORDER — CARBIDOPA-LEVODOPA 25-100 MG PO TABS: 1.0000 | ORAL_TABLET | Freq: Three times a day (TID) | ORAL | Status: DC

## 2014-02-15 NOTE — Patient Instructions (Signed)
Parkinson Disease Parkinson disease is a disorder of the central nervous system, which includes the brain and spinal cord. A person with this disease slowly loses the ability to completely control body movements. Within the brain, there is a group of nerve cells (basal ganglia) that help control movement. The basal ganglia are damaged and do not work properly in a person with Parkinson disease. In addition, the basal ganglia produce and use a brain chemical called dopamine. The dopamine chemical sends messages to other parts of the body to control and coordinate body movements. Dopamine levels are low in a person with Parkinson disease. If the dopamine levels are low, then the body does not receive the correct messages it needs to move normally.  CAUSES  The exact reason why the basal ganglia get damaged is not known. Some medical researchers have thought that infection, genes, environment, and certain medicines may contribute to the cause.  SYMPTOMS   An early symptom of Parkinson disease is often an uncontrolled shaking (tremor) of the hands. The tremor will often disappear when the affected hand is consciously used.  As the disease progresses, walking, talking, getting out of a chair, and new movements become more difficult.  Muscles get stiff and movements become slower.  Balance and coordination become harder.  Depression, trouble swallowing, urinary problems, constipation, and sleep problems can occur.  Later in the disease, memory and thought processes may deteriorate. DIAGNOSIS  There are no specific tests to diagnose Parkinson disease. You may be referred to a neurologist for evaluation. Your caregiver will ask about your medical history, symptoms, and perform a physical exam. Blood tests and imaging tests of your brain may be performed to rule out other diseases. The imaging tests may include an MRI or a CT scan. TREATMENT  The goal of treatment is to relieve symptoms. Medicines may be  prescribed once the symptoms become troublesome. Medicine will not stop the progression of the disease, but medicine can make movement and balance better and help control tremors. Speech and occupational therapy may also be prescribed. Sometimes, surgical treatment of the brain can be done in young people. HOME CARE INSTRUCTIONS  Get regular exercise and rest periods during the day to help prevent exhaustion and depression.  If getting dressed becomes difficult, replace buttons and zippers with Velcro and elastic on your clothing.  Take all medicine as directed by your caregiver.  Install grab bars or railings in your home to prevent falls.  Go to speech or occupational therapy as directed.  Keep all follow-up visits as directed by your caregiver. SEEK MEDICAL CARE IF:  Your symptoms are not controlled with your medicine.  You fall.  You have trouble swallowing or choke on your food. MAKE SURE YOU:  Understand these instructions.  Will watch your condition.  Will get help right away if you are not doing well or get worse. Document Released: 07/16/2000 Document Revised: 11/13/2012 Document Reviewed: 08/18/2011 Exit[employer] Patient Information 2015 [employer]. This information is not intended to replace advice given to you by your health care provider. Make sure you discuss any questions you have with your health care provider.  

## 2014-02-15 NOTE — Progress Notes (Signed)
Reason for visit: Parkinson's disease  Melinda ParrBetty C Hickman is an 80 y.o. female  History of present illness:  [name] is a 80 year old right-handed white female with a history of Parkinson's disease. The patient does not follow up well through this office, she was last seen in November of 2013, and it had been over a year since she had followed up previously prior to that evaluation. The patient is on Sinemet 25/100 mg tablets 3 times daily. The patient indicates that she is currently living alone, and she is managing her own medications. The patient did not take her Sinemet this morning as she was running late, and she comes in 45 minutes late for her appointment today. The patient does operate a motor vehicle, and she requires some assistance with managing her finances. The patient does report some memory issues. The patient denies any choking problems, but she does have chronic diarrhea and this has been associated with weight loss. The patient goes on to say that she takes 1200 mg of magnesium oxide twice daily. The patient has not had any falls, she uses a cane for ambulation. The patient does not have any steps getting in and out of her house. She comes to this office for an evaluation.  Past Medical History  Diagnosis Date  . Coronary artery disease   . Hypertension   . Heart murmur   . Shortness of breath   . Dysrhythmia     atrial fib  . Arthritis   . Neuromuscular disorder     parkinsons  . CHF (congestive heart failure)   . Atrial fibrillation   . Gout   . Osteoporosis   . Hypomagnesemia     Hx.  . Mild memory disturbance   . Peptic ulcer disease   . Degenerative arthritis   . Thyroid goiter     resection    Past Surgical History  Procedure Laterality Date  . Pacemaker placement    . Insert / replace / remove pacemaker    . Abdominal hysterectomy    . Cholecystectomy    . Toe nails      removed  . Bladder tack    . Appendectomy    . Tonsillectomy    .  Kyphoplasty      Compression fracture, lumbosacral spine  . Rectocele repair    . Cataract extraction, bilateral    . Hiatal hernia repair      Family History  Problem Relation Age of Onset  . Stroke Sister   . Parkinsonism Brother   . Diabetes Mother   . Heart attack Father     Social history:  reports that she has never smoked. She has never used smokeless tobacco. She reports that she does not drink alcohol or use illicit drugs.   No Known Allergies  Medications:  Current Outpatient Prescriptions on File Prior to Visit  Medication Sig Dispense Refill  . acetaminophen (TYLENOL) 325 MG tablet Take 162 mg by mouth daily.      Marland Kitchen. allopurinol (ZYLOPRIM) 100 MG tablet Take 100 mg by mouth daily.      Marland Kitchen. aMILoride (MIDAMOR) 5 MG tablet Take 1 tablet (5 mg total) by mouth daily.  90 tablet  3  . aspirin EC 81 MG tablet Take 81 mg by mouth daily.      Marland Kitchen. loperamide (IMODIUM A-D) 2 MG tablet Take 2-4 mg by mouth 2 (two) times daily. 4mg  in am, 2mg  in pm      .  magnesium oxide (MAG-OX) 400 MG tablet Take 1,200 mg by mouth 2 (two) times daily.      . metoprolol (LOPRESSOR) 50 MG tablet Take 50 mg by mouth 2 (two) times daily.      Marland Kitchen OVER THE COUNTER MEDICATION Take 1 tablet by mouth daily. Calcium      . furosemide (LASIX) 20 MG tablet Take 1 tablet (20 mg total) by mouth daily.  30 tablet  1   No current facility-administered medications on file prior to visit.    ROS:  Out of a complete 14 system review of symptoms, the patient complains only of the following symptoms, and all other reviewed systems are negative.  Diarrhea Memory loss, tremors Gait disorder  Blood pressure 146/84, pulse 66, weight 113 lb (51.256 kg).  Physical Exam  General: The patient is alert and cooperative at the time of the examination.  Skin: No significant peripheral edema is noted.   Neurologic Exam  Mental status: The Mini-Mental status examination done today shows a total score 25/30.  Cranial  nerves: Facial symmetry is present. Speech is normal, no aphasia or dysarthria is noted. Extraocular movements are full. Visual fields are full.  Motor: The patient has good strength in all 4 extremities.  Sensory examination: Soft touch sensation is symmetric on the face, arms, and legs.  Coordination: The patient has good finger-nose-finger and heel-to-shin bilaterally. The patient has some apraxia with the use of the lower extremities.  Gait and station: The patient is able to arise from a seated position with the arms crossed. Once up, she is able to ambulate independently, short shuffling steps. Bilateral tremors are noted, right greater than left. The patient has decreased arm swing bilaterally. Tandem gait was not attempted. Romberg is negative. No drift is seen.  Reflexes: Deep tendon reflexes are symmetric.   Assessment/Plan:  1. Parkinson's disease  2. Mild memory disturbance  3. Gait disturbance  The patient did not take her Sinemet this morning, and it is difficult to evaluate her mobility this morning. The patient will be on her current dose of the Sinemet, and she will followup in 4 months. The patient is to reduce the magnesium oxide dosing to help improve the diarrhea. She will need to be followed for her memory issue.  Melinda Palau MD 02/17/2014 10:08 AM  [facility] [address] [place], [place] [address]  Phone [phone] Fax [phone]

## 2014-02-19 ENCOUNTER — Other Ambulatory Visit: Payer: Self-pay | Admitting: Neurology

## 2014-04-22 ENCOUNTER — Encounter: Payer: Self-pay | Admitting: Internal Medicine

## 2014-04-22 DIAGNOSIS — I4891 Unspecified atrial fibrillation: Secondary | ICD-10-CM

## 2014-05-19 ENCOUNTER — Inpatient Hospital Stay (HOSPITAL_COMMUNITY)
Admission: EM | Admit: 2014-05-19 | Discharge: 2014-05-22 | DRG: 065 | Disposition: A | Discharge status: Skilled Nursing Facility | Payer: Medicare Other | Attending: Internal Medicine | Admitting: Internal Medicine

## 2014-05-19 ENCOUNTER — Emergency Department (HOSPITAL_COMMUNITY): Payer: Medicare Other

## 2014-05-19 ENCOUNTER — Encounter (HOSPITAL_COMMUNITY): Payer: Self-pay | Admitting: Emergency Medicine

## 2014-05-19 ENCOUNTER — Inpatient Hospital Stay (HOSPITAL_COMMUNITY): Payer: Medicare Other

## 2014-05-19 DIAGNOSIS — Z9841 Cataract extraction status, right eye: Secondary | ICD-10-CM | POA: Diagnosis not present

## 2014-05-19 DIAGNOSIS — Z8711 Personal history of peptic ulcer disease: Secondary | ICD-10-CM

## 2014-05-19 DIAGNOSIS — Z9842 Cataract extraction status, left eye: Secondary | ICD-10-CM | POA: Diagnosis not present

## 2014-05-19 DIAGNOSIS — I2510 Atherosclerotic heart disease of native coronary artery without angina pectoris: Secondary | ICD-10-CM | POA: Diagnosis present

## 2014-05-19 DIAGNOSIS — G20 Parkinson's disease: Secondary | ICD-10-CM

## 2014-05-19 DIAGNOSIS — R471 Dysarthria and anarthria: Secondary | ICD-10-CM

## 2014-05-19 DIAGNOSIS — I639 Cerebral infarction, unspecified: Secondary | ICD-10-CM

## 2014-05-19 DIAGNOSIS — Z7982 Long term (current) use of aspirin: Secondary | ICD-10-CM

## 2014-05-19 DIAGNOSIS — Z79899 Other long term (current) drug therapy: Secondary | ICD-10-CM | POA: Diagnosis not present

## 2014-05-19 DIAGNOSIS — M1990 Unspecified osteoarthritis, unspecified site: Secondary | ICD-10-CM

## 2014-05-19 DIAGNOSIS — I129 Hypertensive chronic kidney disease with stage 1 through stage 4 chronic kidney disease, or unspecified chronic kidney disease: Secondary | ICD-10-CM | POA: Diagnosis present

## 2014-05-19 DIAGNOSIS — N189 Chronic kidney disease, unspecified: Secondary | ICD-10-CM

## 2014-05-19 DIAGNOSIS — E785 Hyperlipidemia, unspecified: Secondary | ICD-10-CM | POA: Diagnosis present

## 2014-05-19 DIAGNOSIS — G20A1 Parkinson's disease without dyskinesia, without mention of fluctuations: Secondary | ICD-10-CM

## 2014-05-19 DIAGNOSIS — Z950 Presence of cardiac pacemaker: Secondary | ICD-10-CM

## 2014-05-19 DIAGNOSIS — Z823 Family history of stroke: Secondary | ICD-10-CM

## 2014-05-19 DIAGNOSIS — R4701 Aphasia: Secondary | ICD-10-CM | POA: Diagnosis present

## 2014-05-19 DIAGNOSIS — I480 Paroxysmal atrial fibrillation: Secondary | ICD-10-CM | POA: Diagnosis present

## 2014-05-19 DIAGNOSIS — R531 Weakness: Secondary | ICD-10-CM

## 2014-05-19 DIAGNOSIS — G709 Myoneural disorder, unspecified: Secondary | ICD-10-CM

## 2014-05-19 DIAGNOSIS — M810 Age-related osteoporosis without current pathological fracture: Secondary | ICD-10-CM | POA: Diagnosis present

## 2014-05-19 DIAGNOSIS — R42 Dizziness and giddiness: Secondary | ICD-10-CM

## 2014-05-19 DIAGNOSIS — M109 Gout, unspecified: Secondary | ICD-10-CM | POA: Diagnosis present

## 2014-05-19 DIAGNOSIS — N183 Chronic kidney disease, stage 3 unspecified: Secondary | ICD-10-CM

## 2014-05-19 DIAGNOSIS — R011 Cardiac murmur, unspecified: Secondary | ICD-10-CM

## 2014-05-19 DIAGNOSIS — N179 Acute kidney failure, unspecified: Secondary | ICD-10-CM

## 2014-05-19 DIAGNOSIS — R269 Unspecified abnormalities of gait and mobility: Secondary | ICD-10-CM

## 2014-05-19 DIAGNOSIS — I63412 Cerebral infarction due to embolism of left middle cerebral artery: Secondary | ICD-10-CM | POA: Diagnosis present

## 2014-05-19 DIAGNOSIS — I5042 Chronic combined systolic (congestive) and diastolic (congestive) heart failure: Secondary | ICD-10-CM | POA: Diagnosis present

## 2014-05-19 DIAGNOSIS — R0602 Shortness of breath: Secondary | ICD-10-CM

## 2014-05-19 DIAGNOSIS — E860 Dehydration: Secondary | ICD-10-CM

## 2014-05-19 DIAGNOSIS — D689 Coagulation defect, unspecified: Secondary | ICD-10-CM

## 2014-05-19 DIAGNOSIS — I442 Atrioventricular block, complete: Secondary | ICD-10-CM

## 2014-05-19 DIAGNOSIS — I4891 Unspecified atrial fibrillation: Secondary | ICD-10-CM

## 2014-05-19 HISTORY — DX: Parkinson's disease: G20

## 2014-05-19 HISTORY — DX: Parkinson's disease without dyskinesia, without mention of fluctuations: G20.A1

## 2014-05-19 LAB — DIFFERENTIAL
Basophils Absolute: 0 10*3/uL (ref 0.0–0.1)
Basophils Relative: 0 % (ref 0–1)
EOS PCT: 4 % (ref 0–5)
Eosinophils Absolute: 0.2 10*3/uL (ref 0.0–0.7)
LYMPHS ABS: 1 10*3/uL (ref 0.7–4.0)
LYMPHS PCT: 19 % (ref 12–46)
MONO ABS: 0.4 10*3/uL (ref 0.1–1.0)
MONOS PCT: 7 % (ref 3–12)
NEUTROS ABS: 3.9 10*3/uL (ref 1.7–7.7)
Neutrophils Relative %: 70 % (ref 43–77)

## 2014-05-19 LAB — CBC
HCT: 34.5 % — ABNORMAL LOW (ref 36.0–46.0)
HEMOGLOBIN: 11.3 g/dL — AB (ref 12.0–15.0)
MCH: 32.5 pg (ref 26.0–34.0)
MCHC: 32.8 g/dL (ref 30.0–36.0)
MCV: 99.1 fL (ref 78.0–100.0)
Platelets: 139 10*3/uL — ABNORMAL LOW (ref 150–400)
RBC: 3.48 MIL/uL — ABNORMAL LOW (ref 3.87–5.11)
RDW: 14.2 % (ref 11.5–15.5)
WBC: 5.5 10*3/uL (ref 4.0–10.5)

## 2014-05-19 LAB — COMPREHENSIVE METABOLIC PANEL
ALT: 20 U/L (ref 0–35)
AST: 28 U/L (ref 0–37)
Albumin: 4.4 g/dL (ref 3.5–5.2)
Alkaline Phosphatase: 101 U/L (ref 39–117)
Anion gap: 13 (ref 5–15)
BUN: 35 mg/dL — AB (ref 6–23)
CALCIUM: 9.6 mg/dL (ref 8.4–10.5)
CHLORIDE: 100 meq/L (ref 96–112)
CO2: 28 meq/L (ref 19–32)
CREATININE: 1.35 mg/dL — AB (ref 0.50–1.10)
GFR, EST AFRICAN AMERICAN: 42 mL/min — AB (ref >90)
GFR, EST NON AFRICAN AMERICAN: 36 mL/min — AB (ref >90)
GLUCOSE: 94 mg/dL (ref 70–99)
Potassium: 4 mEq/L (ref 3.7–5.3)
Sodium: 141 mEq/L (ref 137–147)
Total Bilirubin: 0.8 mg/dL (ref 0.3–1.2)
Total Protein: 7.2 g/dL (ref 6.0–8.3)

## 2014-05-19 LAB — MAGNESIUM
MAGNESIUM: 1.7 mg/dL (ref 1.5–2.5)
Magnesium: 1.8 mg/dL (ref 1.5–2.5)

## 2014-05-19 LAB — PROTIME-INR
INR: 1.25 (ref 0.00–1.49)
Prothrombin Time: 15.8 seconds — ABNORMAL HIGH (ref 11.6–15.2)

## 2014-05-19 LAB — I-STAT TROPONIN, ED: TROPONIN I, POC: 0.04 ng/mL (ref 0.00–0.08)

## 2014-05-19 LAB — CBG MONITORING, ED: Glucose-Capillary: 97 mg/dL (ref 70–99)

## 2014-05-19 LAB — TSH: TSH: 1.11 u[IU]/mL (ref 0.350–4.500)

## 2014-05-19 LAB — APTT: APTT: 32 s (ref 24–37)

## 2014-05-19 MED ORDER — STROKE: EARLY STAGES OF RECOVERY BOOK
Freq: Once | Status: AC
  Administered 2014-05-19: 16:00:00
  Filled 2014-05-19: qty 1

## 2014-05-19 MED ORDER — INFLUENZA VAC SPLIT QUAD 0.5 ML IM SUSY
0.5000 mL | PREFILLED_SYRINGE | INTRAMUSCULAR | Status: AC
  Administered 2014-05-20: 0.5 mL via INTRAMUSCULAR
  Filled 2014-05-19: qty 0.5

## 2014-05-19 MED ORDER — METOPROLOL TARTRATE 25 MG PO TABS
25.0000 mg | ORAL_TABLET | Freq: Two times a day (BID) | ORAL | Status: DC
  Administered 2014-05-19: 25 mg via ORAL
  Filled 2014-05-19: qty 1

## 2014-05-19 MED ORDER — AMILORIDE HCL 5 MG PO TABS
10.0000 mg | ORAL_TABLET | ORAL | Status: DC
  Administered 2014-05-20 – 2014-05-22 (×2): 10 mg via ORAL
  Filled 2014-05-19 (×3): qty 2

## 2014-05-19 MED ORDER — PANTOPRAZOLE SODIUM 40 MG PO TBEC
40.0000 mg | DELAYED_RELEASE_TABLET | Freq: Every day | ORAL | Status: DC
  Administered 2014-05-19 – 2014-05-22 (×4): 40 mg via ORAL
  Filled 2014-05-19 (×4): qty 1

## 2014-05-19 MED ORDER — CARBIDOPA-LEVODOPA 25-100 MG PO TABS
1.0000 | ORAL_TABLET | Freq: Two times a day (BID) | ORAL | Status: DC
  Administered 2014-05-19 – 2014-05-22 (×6): 1 via ORAL
  Filled 2014-05-19 (×6): qty 1

## 2014-05-19 MED ORDER — ASPIRIN 325 MG PO TABS
325.0000 mg | ORAL_TABLET | Freq: Every day | ORAL | Status: DC
  Administered 2014-05-19 – 2014-05-22 (×4): 325 mg via ORAL
  Filled 2014-05-19 (×4): qty 1

## 2014-05-19 MED ORDER — ASPIRIN 300 MG RE SUPP: 300.0000 mg | Freq: Every day | RECTAL | Status: DC

## 2014-05-19 MED ORDER — FUROSEMIDE 20 MG PO TABS
20.0000 mg | ORAL_TABLET | Freq: Every day | ORAL | Status: DC
  Administered 2014-05-20 – 2014-05-22 (×3): 20 mg via ORAL
  Filled 2014-05-19 (×3): qty 1

## 2014-05-19 MED ORDER — PNEUMOCOCCAL VAC POLYVALENT 25 MCG/0.5ML IJ INJ
0.5000 mL | INJECTION | INTRAMUSCULAR | Status: AC
  Administered 2014-05-20: 0.5 mL via INTRAMUSCULAR
  Filled 2014-05-19: qty 0.5

## 2014-05-19 MED ORDER — ALLOPURINOL 100 MG PO TABS
200.0000 mg | ORAL_TABLET | Freq: Every day | ORAL | Status: DC
  Administered 2014-05-19 – 2014-05-22 (×4): 200 mg via ORAL
  Filled 2014-05-19 (×4): qty 2

## 2014-05-19 MED ORDER — SENNOSIDES-DOCUSATE SODIUM 8.6-50 MG PO TABS: 1.0000 | ORAL_TABLET | Freq: Every evening | ORAL | Status: DC | PRN

## 2014-05-19 MED ORDER — HEPARIN SODIUM (PORCINE) 5000 UNIT/ML IJ SOLN
5000.0000 [IU] | Freq: Three times a day (TID) | INTRAMUSCULAR | Status: DC
  Administered 2014-05-19 – 2014-05-21 (×6): 5000 [IU] via SUBCUTANEOUS
  Filled 2014-05-19 (×6): qty 1

## 2014-05-19 NOTE — H&P (Signed)
[facility] History and Physical  KATALYNA SOCARRAS ZOX:[id number] DOB: 1932-10-15 DOA: 05/19/2014  Referring physician:  PCP: Ailene Ravel, MD   Chief Complaint: Dysarthria  HPI: [name] is a 80 y.o. female with a past medical history of atrial fibrillation, history of diastolic and systolic congestive heart failure, stage III chronic kidney disease, Parkinson's disease, reporting she had been on anticoagulation with Coumadin up to 1 year ago that was discontinued for recurrent falls, brought to the emergency department with complaints of dysarthria appear. patient unable to provide history as history was obtained from her daughter who was present at bedside. [name] currently resides alone at home. Her daughter called her yesterday evening between 7 and 8 PM noting that she had normal speech at the time and appeared to be functioning at her baseline level. She called her again this morning planning to take her to church however noticed a significant change, stating that she was "not making any sense." Her daughter went to her house immediately where she found her sitting on the couch. She noticed several things were spilled on the floor which had not been cleaned up, which was not like her mother. She noticed ongoing difficulty with speech and was brought to the hospital. In the emergency department she was seen and evaluated by neurology. A CT scan of brain showed left frontal lobe encephalomalacia compatible with previous cortical infarct, no acute intracranial abnormalities noted.                                                                                                                                                                                                                            Review of Systems:  Unable to obtain reliable review of systems, given significant dysarthria  Past Medical History  Diagnosis Date  . Coronary artery disease   . Hypertension   .  Heart murmur   . Shortness of breath   . Dysrhythmia     atrial fib  . Arthritis   . Neuromuscular disorder     parkinsons  . CHF (congestive heart failure)   . Atrial fibrillation   . Gout   . Osteoporosis   . Hypomagnesemia     Hx.  . Mild memory disturbance   . Peptic ulcer disease   . Degenerative arthritis   . Thyroid goiter     resection  . Parkinson disease    Past Surgical History  Procedure Laterality Date  .  Pacemaker placement    . Insert / replace / remove pacemaker    . Abdominal hysterectomy    . Cholecystectomy    . Toe nails      removed  . Bladder tack    . Appendectomy    . Tonsillectomy    . Kyphoplasty      Compression fracture, lumbosacral spine  . Rectocele repair    . Cataract extraction, bilateral    . Hiatal hernia repair     Social History:  reports that she has never smoked. She has never used smokeless tobacco. She reports that she does not drink alcohol or use illicit drugs.  No Known Allergies  Family History  Problem Relation Age of Onset  . Stroke Sister   . Parkinsonism Brother   . Diabetes Mother   . Heart attack Father      Prior to Admission medications   Medication Sig Start Date End Date Taking? Authorizing Provider  acetaminophen (TYLENOL) 325 MG tablet Take 162.5 mg by mouth every 6 (six) hours as needed for headache.    Yes Historical Provider, MD  allopurinol (ZYLOPRIM) 100 MG tablet Take 200 mg by mouth daily.    Yes Historical Provider, MD  aMILoride (MIDAMOR) 5 MG tablet Take 10 mg by mouth 3 (three) times a week. Monday, Wednesday and Friday.   Yes Historical Provider, MD  aspirin EC 81 MG tablet Take 81 mg by mouth daily.   Yes Historical Provider, MD  Calcium Carb-Cholecalciferol (CALCIUM + D3) 600-200 MG-UNIT TABS Take 1 tablet by mouth daily.   Yes Historical Provider, MD  carbidopa-levodopa (SINEMET IR) 25-100 MG per tablet Take 1 tablet by mouth 2 (two) times daily.   Yes Historical Provider, MD  furosemide  (LASIX) 20 MG tablet Take 1 tablet (20 mg total) by mouth daily. 04/12/12 05/19/14 Yes Estela Isaiah BlakesY Hernandez Acosta, MD  IRON PO Take 65 mg by mouth daily.   Yes Historical Provider, MD  magnesium oxide (MAG-OX) 400 MG tablet Take 1,200 mg by mouth 2 (two) times daily.   Yes Historical Provider, MD  metoprolol (LOPRESSOR) 50 MG tablet Take 50 mg by mouth 2 (two) times daily.   Yes Historical Provider, MD  PREVALITE 4 G packet Take 4 g by mouth daily. 04/03/14  Yes Historical Provider, MD   Physical Exam: Filed Vitals:   05/19/14 1200 05/19/14 1241 05/19/14 1254 05/19/14 1515  BP: 147/74 128/63  154/67  Pulse: 64 65  65  Temp: 98.1 F (36.7 C)  98.1 F (36.7 C)   TempSrc: Oral     Resp: 20 16  14   SpO2: 100% 97%  96%    Wt Readings from Last 3 Encounters:  02/15/14 51.256 kg (113 lb)  08/13/13 54.432 kg (120 lb)  06/19/12 51.71 kg (114 lb)    General:  Appears calm and comfortable, having significant dysarthria and word substitutions, as well as difficulties following commands. Eyes: PERRL, normal lids, irises & conjunctiva ENT: grossly normal hearing, lips & tongue Neck: no LAD, masses or thyromegaly Cardiovascular: RRR, no m/r/g. No LE edema. Telemetry: SR, no arrhythmias  Respiratory: CTA bilaterally, no w/r/r. Normal respiratory effort. Abdomen: soft, ntnd Skin: no rash or induration seen on limited exam Musculoskeletal: grossly normal tone BUE/BLE Psychiatric: grossly normal mood and affect, speech fluent and appropriate Neurologic: Patient is dysarthric, with word substitutions, having difficulties also understanding spoken language and following commands, extraocular movement was intact, pupils equal round reactive, no facial droop or tongue deviation,  has moderate rigidity to bilateral upper extremities and associated tremor, appear to have 5 of 5 muscle strength to bilateral upper and lower extremities, patient could not participate in finger to nose or heel to shin exam.             Labs on Admission:  Basic Metabolic Panel:  Recent Labs Lab 05/19/14 1201 05/19/14 1203  NA 141  --   K 4.0  --   CL 100  --   CO2 28  --   GLUCOSE 94  --   BUN 35*  --   CREATININE 1.35*  --   CALCIUM 9.6  --   MG  --  1.8   Liver Function Tests:  Recent Labs Lab 05/19/14 1201  AST 28  ALT 20  ALKPHOS 101  BILITOT 0.8  PROT 7.2  ALBUMIN 4.4   No results found for this basename: LIPASE, AMYLASE,  in the last 168 hours No results found for this basename: AMMONIA,  in the last 168 hours CBC:  Recent Labs Lab 05/19/14 1201  WBC 5.5  NEUTROABS 3.9  HGB 11.3*  HCT 34.5*  MCV 99.1  PLT 139*   Cardiac Enzymes: No results found for this basename: CKTOTAL, CKMB, CKMBINDEX, TROPONINI,  in the last 168 hours  BNP (last 3 results) No results found for this basename: PROBNP,  in the last 8760 hours CBG:  Recent Labs Lab 05/19/14 1202  GLUCAP 97    Radiological Exams on Admission: Ct Head (brain) Wo Contrast  05/19/2014   CLINICAL DATA:  Not feeling well since yesterday. Difficulty speaking and answering questions. Numbness to left leg.  EXAM: CT HEAD WITHOUT CONTRAST  TECHNIQUE: Contiguous axial images were obtained from the base of the skull through the vertex without intravenous contrast.  COMPARISON:  08/31/2011  FINDINGS: There is diffuse low attenuation within the subcortical and periventricular white matter compatible with chronic microvascular disease. Left frontal lobe encephalomalacia is identified compatible with previous infarct. Prominence of the sulci and ventricles are identified consistent with brain atrophy. No evidence for acute brain infarct, hemorrhage or mass.  The paranasal sinuses are clear. The mastoid air cells are clear. The skull appears intact.  IMPRESSION: 1. Small vessel ischemic disease and brain atrophy. 2. Left frontal lobe encephalomalacia compatible with previous cortical infarct. 3. No acute intracranial abnormalities noted.    Electronically Signed   By: Signa Kell M.D.   On: 05/19/2014 13:55    EKG: Independently reviewed. EKG showing sinus rhythm  Assessment/Plan Principal Problem:   Stroke Active Problems:   A-fib   Dysarthria   CKD (chronic kidney disease) stage 3, GFR 30-59 ml/min   CVA (cerebral infarction)   Parkinson disease   Pacemaker   1. Likely acute CVA. Patient having a history of atrial fibrillation, had not been on anticoagulation for the past year. She presents with significant dysarthria, word finding difficulties and word substitution, as CT scan of brain did not show acute intracranial findings. Daughter reporting that patient's have been normal over the telephone yesterday evening. Family members finding her dysarthric this morning. Neurology consulted. Will place her on CVA protocol. She has a history of pacemaker implant which may preclude the utilization of MRI. Will obtain transthoracic echocardiogram and carotid Dopplers, consider repeat CT scan. Consult physical therapy, occupational therapy and speech pathology. Start aspirin 325 mg by mouth daily. Likely be restarted on anticoagulation.   2. History of atrial fibrillation. EKG showing sinus rhythm with ventricular rates in the  70s. Will continue metoprolol at 25 mg by mouth twice a day, laser on continuous cardiac monitoring. 3. Chronic systolic and diastolic congestive heart failure. Patient clinically compensated. Her last transthoracic echocardiogram was performed on 04/06/2012 which showed ejection fraction of 25-30% with grade 3 diastolic dysfunction. Will continue to home regimen with Lasix 20 mg by mouth daily and Amiloride 10 mg 3 days a week. 4. Parkinson's disease. Neurology consulted, will continue home regimen with carbidopa/levodopa  one tablet twice daily 5. Stage III chronic kidney disease. Initial lab work showing creatinine of 1.35 with BUN of 35, having stable kidney function. 6. Status post pacemaker implant with  generator replacement in 2004. Patient with history of atrial fibrillation status post AV junction ablation. 7. DVT prophylaxis. Lovenox   Code Status: Full code Family Communication: Spoke to her daughter present at bedside Disposition Plan: Anticipate she'll require greater than 2 night hospitalization  Time spent: 70 min  Jeralyn BennettZAMORA, Marry Kusch [facility] Pager [phone]

## 2014-05-19 NOTE — Consult Note (Addendum)
Neurology Consultation Reason for Consult: Aphasia Referring Physician: Plunkett, W.  CC: Aphasia  History is obAnitra Hickman from: Daughter  HPI: [name] is a 80 y.o. female was last seen well prior to bedtime last night who presents with aphasia. She has a history of Parkinson's disease treated with carbidopa levodopa 25-100 3 times a day. She was in her normal state of health yesterday evening, but when her daughter called her this morning she could not understand what she was sitting up the phone.     LKW: 10/17 8 PM tpa given?: no, outside of window    ROS: Unable to obtain due to aphasia  Past Medical History  Diagnosis Date  . Coronary artery disease   . Hypertension   . Heart murmur   . Shortness of breath   . Dysrhythmia     atrial fib  . Arthritis   . Neuromuscular disorder     parkinsons  . CHF (congestive heart failure)   . Atrial fibrillation   . Gout   . Osteoporosis   . Hypomagnesemia     Hx.  . Mild memory disturbance   . Peptic ulcer disease   . Degenerative arthritis   . Thyroid goiter     resection  . Parkinson disease     Family History: Sister - stroke  Social History: Tob: denies  Exam: Current vital signs: BP 147/80  Pulse 65  Temp(Src) 98 F (36.7 C) (Oral)  Resp 14  SpO2 96% Vital signs in last 24 hours: Temp:  [98 F (36.7 C)-98.1 F (36.7 C)] 98 F (36.7 C) (10/18 1613) Pulse Rate:  [64-65] 65 (10/18 1613) Resp:  [14-20] 14 (10/18 1613) BP: (128-154)/(63-109) 147/80 mmHg (10/18 1613) SpO2:  [96 %-100 %] 96 % (10/18 1613)  General: in bed, NAD CV: RRR Mental Status: Patient is awake, alert, she has a fluent aphasia with frequent word substitution errors. She can follow simple commands, but not more complicated ones.  No signs of or neglect Cranial Nerves: II: Appears to have a right field cut. Pupils are equal, round, and reactive to light.  Discs are difficult to visualize. III,IV, VI: EOMI without ptosis or  diploplia.  V: Facial sensation is symmetric to temperature VII: Facial movement is symmetric.  VIII: hearing is intact to voice X: Uvula elevates symmetrically XI: Shoulder shrug is symmetric. XII: tongue is midline without atrophy or fasciculations.  Motor: Tone is increased with resting tremor L > R. Bulk is normal. 5/5 strength was present in all four extremities.  Sensory: Sensation is symmetric to light touch and temperature in the arms and legs. Deep Tendon Reflexes: 2+ and symmetric in the biceps and patellae.  Plantars: Toes are downgoing bilaterally.  Cerebellar: No clear ataxia, but difficulty  Gait: Not tested secondary to multiple medical monitors in ED setting     I have reviewed labs in epic and the results pertinent to this consultation are: Mildly elevated creatinine  I have reviewed the images obtained: CT head-negative  Impression: 80 year old female with a right field cut and fluent aphasia, strongly suspicious for inferior division MCA infarct. Likely related to atrial fibrillation. Would favor getting MRI prior to deciding about anticoagulation.   Recommendations: 1. HgbA1c, fasting lipid panel 2. MRI, MRA  of the brain without contrast 3. Frequent neuro checks 4. Echocardiogram 5. Carotid dopplers 6. Prophylactic therapy-Antiplatelet med: Aspirin - dose 325mg  PO or 300mg  PR 7. Risk factor modification 8. Telemetry monitoring 9. PT consult, OT consult, Speech  consult 10. Continue Sinemet 25-100 3 times a day   Melinda SlotMcNeill Melinda Rottman, MD [facility] [phone]  If 7pm- 7am, please page neurology on call as listed in [facility]ION.

## 2014-05-19 NOTE — ED Notes (Signed)
Patient transported to CT 

## 2014-05-19 NOTE — ED Notes (Addendum)
Pt reports not feeling well since yesterday. Pt having difficulty speaking and answering questions at triage. Grips equal at triage, pt does reports feeling numbness to left leg. Family reports last talking to pt last night and she sounded normal but called her this am and unable to understand what pt was saying.

## 2014-05-19 NOTE — Progress Notes (Signed)
Patient arrived on the unit.  MD notified.

## 2014-05-19 NOTE — ED Provider Notes (Signed)
CSN: [id number]     Arrival date & time 05/19/14  1151 History   First MD Initiated Contact with Patient 05/19/14 1215     Chief Complaint  Patient presents with  . Aphasia     (Consider location/radiation/quality/duration/timing/severity/associated sxs/prior Treatment) HPI Comments: Daughter states that patient was normal at 8 PM last night when she spoke to her on the phone and went over to her house. This morning at approximately 8 AM when the culture to see if she was ready to go to church they could not understand anything she was saying. When they arrived at her house it appears that she had spilled multiple things on the floor and continued to have difficulty with speech.  Per daughter patient had no other complaints over the last few days she only complained of a headache this morning  The history is provided by a relative.    Past Medical History  Diagnosis Date  . Coronary artery disease   . Hypertension   . Heart murmur   . Shortness of breath   . Dysrhythmia     atrial fib  . Arthritis   . Neuromuscular disorder     parkinsons  . CHF (congestive heart failure)   . Atrial fibrillation   . Gout   . Osteoporosis   . Hypomagnesemia     Hx.  . Mild memory disturbance   . Peptic ulcer disease   . Degenerative arthritis   . Thyroid goiter     resection  . Parkinson disease    Past Surgical History  Procedure Laterality Date  . Pacemaker placement    . Insert / replace / remove pacemaker    . Abdominal hysterectomy    . Cholecystectomy    . Toe nails      removed  . Bladder tack    . Appendectomy    . Tonsillectomy    . Kyphoplasty      Compression fracture, lumbosacral spine  . Rectocele repair    . Cataract extraction, bilateral    . Hiatal hernia repair     Family History  Problem Relation Age of Onset  . Stroke Sister   . Parkinsonism Brother   . Diabetes Mother   . Heart attack Father    History  Substance Use Topics  . Smoking status: Never  Smoker   . Smokeless tobacco: Never Used  . Alcohol Use: No   OB History   Grav Para Term Preterm Abortions TAB SAB Ect Mult Living                 Review of Systems  All other systems reviewed and are negative.     Allergies  Review of patient's allergies indicates no known allergies.  Home Medications   Prior to Admission medications   Medication Sig Start Date End Date Taking? Authorizing Provider  acetaminophen (TYLENOL) 325 MG tablet Take 162.5 mg by mouth every 6 (six) hours as needed for headache.    Yes Historical Provider, MD  allopurinol (ZYLOPRIM) 100 MG tablet Take 200 mg by mouth daily.    Yes Historical Provider, MD  aMILoride (MIDAMOR) 5 MG tablet Take 10 mg by mouth 3 (three) times a week. Monday, Wednesday and Friday.   Yes Historical Provider, MD  aspirin EC 81 MG tablet Take 81 mg by mouth daily.   Yes Historical Provider, MD  Calcium Carb-Cholecalciferol (CALCIUM + D3) 600-200 MG-UNIT TABS Take 1 tablet by mouth daily.   Yes Historical Provider,  MD  carbidopa-levodopa (SINEMET IR) 25-100 MG per tablet Take 1 tablet by mouth 2 (two) times daily.   Yes Historical Provider, MD  furosemide (LASIX) 20 MG tablet Take 1 tablet (20 mg total) by mouth daily. 04/12/12 05/19/14 Yes Estela Isaiah BlakesY Hernandez Acosta, MD  IRON PO Take 65 mg by mouth daily.   Yes Historical Provider, MD  magnesium oxide (MAG-OX) 400 MG tablet Take 1,200 mg by mouth 2 (two) times daily.   Yes Historical Provider, MD  metoprolol (LOPRESSOR) 50 MG tablet Take 50 mg by mouth 2 (two) times daily.   Yes Historical Provider, MD  PREVALITE 4 G packet Take 4 g by mouth daily. 04/03/14  Yes Historical Provider, MD   BP 128/63  Pulse 65  Temp(Src) 98.1 F (36.7 C) (Oral)  Resp 16  SpO2 97% Physical Exam  Nursing note and vitals reviewed. Constitutional: She is oriented to person, place, and time. She appears well-developed and well-nourished. No distress.  HENT:  Head: Normocephalic and atraumatic.   Mouth/Throat: Oropharynx is clear and moist.  Eyes: Conjunctivae and EOM are normal. Pupils are equal, round, and reactive to light.  Neck: Normal range of motion. Neck supple.  Cardiovascular: Normal rate and intact distal pulses.  An irregularly irregular rhythm present.  Murmur heard. Pulmonary/Chest: Effort normal and breath sounds normal. No respiratory distress. She has no wheezes. She has no rales.  Pace maker in the left chest  Abdominal: Soft. She exhibits no distension. There is no tenderness. There is no rebound and no guarding.  Musculoskeletal: Normal range of motion. She exhibits no edema and no tenderness.  Neurological: She is alert and oriented to person, place, and time. She has normal strength. No sensory deficit.  Right visual field cut.  Expressive aphasia.    Skin: Skin is warm and dry. No rash noted. No erythema.  Psychiatric: She has a normal mood and affect. Her behavior is normal.    ED Course  Procedures (including critical care time) Labs Review Labs Reviewed  PROTIME-INR - Abnormal; Notable for the following:    Prothrombin Time 15.8 (*)    All other components within normal limits  CBC - Abnormal; Notable for the following:    RBC 3.48 (*)    Hemoglobin 11.3 (*)    HCT 34.5 (*)    Platelets 139 (*)    All other components within normal limits  COMPREHENSIVE METABOLIC PANEL - Abnormal; Notable for the following:    BUN 35 (*)    Creatinine, Ser 1.35 (*)    GFR calc non Af Amer 36 (*)    GFR calc Af Amer 42 (*)    All other components within normal limits  APTT  DIFFERENTIAL  MAGNESIUM  I-STAT TROPOININ, ED  CBG MONITORING, ED    Imaging Review No results found.   EKG Interpretation   Date/Time:  Sunday May 19 2014 12:22:24 EDT Ventricular Rate:  65 PR Interval:  66 QRS Duration: 178 QT Interval:  482 QTC Calculation: 501 R Axis:   -83 Text Interpretation:  Sinus rhythm Short PR interval Nonspecific IVCD with  LAD LVH with  secondary repolarization abnormality No significant change  since last tracing Confirmed by Anitra LauthPLUNKETT  MD, Alphonzo LemmingsWHITNEY ([id number]) on  05/19/2014 12:41:20 PM      MDM   Final diagnoses:  None   Patient with symptoms consistent for new CVA with expressive vacation in her right visual field cut. Patient's was last seen normal at 8 PM last night and  upon time of arrival at greater than 12 hours if she is not a TPA candidate. stroke panel ordered without significant lab findings an EKG is unchanged with a paced rhythm. Pt discussed with neuro Dr. Amada Jupiter and agrees with stroke and will follow.  Will admit pt.  Gwyneth Sprout, MD 05/19/14 1353

## 2014-05-20 ENCOUNTER — Inpatient Hospital Stay (HOSPITAL_COMMUNITY): Payer: Medicare Other

## 2014-05-20 DIAGNOSIS — I63412 Cerebral infarction due to embolism of left middle cerebral artery: Secondary | ICD-10-CM | POA: Diagnosis not present

## 2014-05-20 DIAGNOSIS — I059 Rheumatic mitral valve disease, unspecified: Secondary | ICD-10-CM

## 2014-05-20 DIAGNOSIS — Z950 Presence of cardiac pacemaker: Secondary | ICD-10-CM

## 2014-05-20 DIAGNOSIS — I4891 Unspecified atrial fibrillation: Secondary | ICD-10-CM

## 2014-05-20 LAB — LIPID PANEL
CHOL/HDL RATIO: 2.8 ratio
Cholesterol: 130 mg/dL (ref 0–200)
HDL: 46 mg/dL (ref >39)
LDL CALC: 70 mg/dL (ref 0–99)
Triglycerides: 70 mg/dL (ref <150)
VLDL: 14 mg/dL (ref 0–40)

## 2014-05-20 LAB — CBC
HEMATOCRIT: 31.8 % — AB (ref 36.0–46.0)
Hemoglobin: 10.5 g/dL — ABNORMAL LOW (ref 12.0–15.0)
MCH: 32.5 pg (ref 26.0–34.0)
MCHC: 33 g/dL (ref 30.0–36.0)
MCV: 98.5 fL (ref 78.0–100.0)
PLATELETS: 122 10*3/uL — AB (ref 150–400)
RBC: 3.23 MIL/uL — AB (ref 3.87–5.11)
RDW: 14.1 % (ref 11.5–15.5)
WBC: 4.2 10*3/uL (ref 4.0–10.5)

## 2014-05-20 LAB — BASIC METABOLIC PANEL
Anion gap: 11 (ref 5–15)
BUN: 36 mg/dL — AB (ref 6–23)
CALCIUM: 8.9 mg/dL (ref 8.4–10.5)
CO2: 26 mEq/L (ref 19–32)
Chloride: 104 mEq/L (ref 96–112)
Creatinine, Ser: 1.36 mg/dL — ABNORMAL HIGH (ref 0.50–1.10)
GFR calc Af Amer: 42 mL/min — ABNORMAL LOW (ref >90)
GFR calc non Af Amer: 36 mL/min — ABNORMAL LOW (ref >90)
GLUCOSE: 87 mg/dL (ref 70–99)
Potassium: 4.1 mEq/L (ref 3.7–5.3)
Sodium: 141 mEq/L (ref 137–147)

## 2014-05-20 LAB — HEMOGLOBIN A1C
Hgb A1c MFr Bld: 6 % — ABNORMAL HIGH (ref <5.7)
Mean Plasma Glucose: 126 mg/dL — ABNORMAL HIGH (ref <117)

## 2014-05-20 MED ORDER — SIMVASTATIN 20 MG PO TABS
20.0000 mg | ORAL_TABLET | Freq: Every day | ORAL | Status: DC
  Administered 2014-05-20 – 2014-05-21 (×2): 20 mg via ORAL
  Filled 2014-05-20 (×2): qty 1

## 2014-05-20 NOTE — Progress Notes (Signed)
Echo Lab  2D Echocardiogram completed.  Byard Carranza L Maleaha Hughett, RDCS 05/20/2014 10:04 AM

## 2014-05-20 NOTE — Evaluation (Signed)
Physical Therapy Evaluation Patient Details Name: [name] MRN: [id number] DOB: 05/09/1933 Today's Date: 05/20/2014   History of Present Illness  80 y.o. female [facility] with aphasia. CT revealed evolving, acute left MCA infarct involving insula and temporoparietal junction.  Pt cannot have MRI due to pacemaker.  Pt with other significant PMHx of CAD, HTN, SOB, CHF, Parkinson's, A-fib, gout, osteoporosis, mild memory disturbance, and kyphoplasty.    Clinical Impression  Pt is mobilizing well with min assist to supervision overall, however, between her communication deficits and her need to have some light hands- on assist for gait with my recommendation of full time RW use (not the cane anymore) she will need 24/7 supervision.  She is a very high fall risk and would benefit from HHPT and an aide at discharge.  Daughter is interested in list of private pay aids to help supplement the time the family cannot be there with the pt.   PT to follow acutely for deficits listed below.       Follow Up Recommendations Home health PT;Supervision/Assistance - 24 hour (aide, daughter would like private pay sitter list)    Equipment Recommendations  None recommended by PT    Recommendations for Other Services   NA    Precautions / Restrictions Precautions Precautions: Fall Restrictions Weight Bearing Restrictions: No      Mobility  Bed Mobility Overal bed mobility: Needs Assistance Bed Mobility: Sit to Supine       Sit to supine: Mod assist   General bed mobility comments: mod assist of bil legs to get back to supine in the bed.   Transfers Overall transfer level: Needs assistance   Transfers: Sit to/from Stand Sit to Stand: Min assist Stand pivot transfers: Min assist       General transfer comment: Min assist to support trunk to get to standing.  Pt needed assist to establish forward momentum to be able to walk.   Ambulation/Gait Ambulation/Gait  assistance: Min guard;Supervision Ambulation Distance (Feet): 100 Feet Assistive device: Rolling walker (2 wheeled) Gait Pattern/deviations: Step-through pattern;Shuffle;Trunk flexed Gait velocity: decreased   General Gait Details: Pt with suffling gait pattern and flexed trunk. Verbal cues to stay closer to RW.  Intially, pt min guard assist during gait, however, as she continued she was safe to be a little assist as supervision.                Modified Rankin (Stroke Patients Only) Modified Rankin (Stroke Patients Only) Pre-Morbid Rankin Score: Slight disability Modified Rankin: Moderately severe disability     Balance Overall balance assessment: Needs assistance Sitting-balance support: Feet supported;No upper extremity supported Sitting balance-Leahy Scale: Fair Sitting balance - Comments: With seated MMT pt with posterior lean requiring min assist not to lose her balance posteriorly.  Postural control: Posterior lean Standing balance support: No upper extremity supported;Bilateral upper extremity supported Standing balance-Leahy Scale: Fair Standing balance comment: Pt can stand statically without upper extremity support, but is much safer dynamically with bil upper extremities supported.                              Pertinent Vitals/Pain Pain Assessment: No/denies pain    Home Living Family/patient expects to be discharged to:: Private residence Living Arrangements: Alone Available Help at Discharge: Other (Comment);Neighbor;Family (familywilling to hire someone) Type of Home: House Home Access: Stairs to enter Entrance Stairs-Rails: None Entrance Stairs-Number of Steps: 1 Home Layout:  One level Home Equipment: Walker - 4 wheels;Cane - single point;Bedside commode;Shower seat - built in Additional Comments: Per daughter reports she has some word finding difficulty PTA, but not to this extent, otherwise very sharp.     Prior Function Level of  Independence: Independent with assistive device(s)         Comments: uses a cane both inside and outside of the home, pt drives her trash down the road.       Hand Dominance   Dominant Hand: Right    Extremity/Trunk Assessment   Upper Extremity Assessment: Defer to OT evaluation           Lower Extremity Assessment: Generalized weakness;RLE deficits/detail;LLE deficits/detail RLE Deficits / Details: pt with at least 3+/5 strength throughout.  Appears to be mildly weaker on the right side, however, difficult to assess as pt had a hard time following my commands for MMT.  LLE Deficits / Details: At least 3+/5 per seated MMT, however, difficult to assess due to difficulty following commands for MMT.  Left leg seems stronger per testing, however, did not show any asymmetry during gait.   Cervical / Trunk Assessment: Other exceptions  Communication   Communication: Expressive difficulties  Cognition Arousal/Alertness: Awake/alert Behavior During Therapy: WFL for tasks assessed/performed Overall Cognitive Status: Difficult to assess                      General Comments General comments (skin integrity, edema, etc.): I ephasized to the pt and her family that she would be much safer using a RW full time.            Assessment/Plan    PT Assessment Patient needs continued PT services  PT Diagnosis Difficulty walking;Abnormality of gait;Generalized weakness;Altered mental status   PT Problem List Decreased strength;Decreased activity tolerance;Decreased balance;Decreased mobility;Decreased coordination;Decreased cognition;Decreased knowledge of use of DME;Decreased safety awareness;Decreased knowledge of precautions  PT Treatment Interventions Gait training;DME instruction;Stair training;Functional mobility training;Therapeutic activities;Therapeutic exercise;Balance training;Neuromuscular re-education;Patient/family education;Cognitive remediation   PT Goals (Current  goals can be found in the Care Plan section) Acute Rehab PT Goals Patient Stated Goal: go home PT Goal Formulation: With patient/family Time For Goal Achievement: 06/03/14 Potential to Achieve Goals: Good    Frequency Min 4X/week           End of Session Equipment Utilized During Treatment: Gait belt Activity Tolerance: Patient tolerated treatment well Patient left: in bed;with call bell/phone within reach;with family/visitor present           Time: [id number] PT Time Calculation (min): 34 min   Charges:   PT Evaluation $Initial PT Evaluation Tier I: 1 Procedure PT Treatments $Therapeutic Activity: 8-22 mins        Kanton Kamel B. Wisdom Rickey, PT, DPT [phone]   05/20/2014, 10:22 PM

## 2014-05-20 NOTE — Progress Notes (Signed)
VASCULAR LAB PRELIMINARY  PRELIMINARY  PRELIMINARY  PRELIMINARY  Carotid Dopplers completed.    Preliminary report:  1-39% ICA stenosis.  Vertebral artery flow is antegrade.   Tynetta Bachmann, RVT 05/20/2014, 9:55 AM

## 2014-05-20 NOTE — Progress Notes (Signed)
[facility] PROGRESS NOTE  [name] ZOX:[id number] DOB: [birth year] DOA: 05/19/2014 PCP: Ailene RavelHAMRICK,MAURA L, MD  Assessment/Plan: 1. Acute MCA territory CVA -Patient presenting with dysarthria, word substitution and difficulty getting words out. She has a history of atrial fibrillation and had not been on anticoagulation therapy -Her repeat CT scan of brain without contrast revealing an acute left MCA territory -Patient on aspirin 325 mg by mouth daily -Neurology consulted, will consider anticoagulation at discharge -Physical therapy, occupational therapy and speech pathology consultation. -Transthoracic echocardiogram showing ejection fraction of 35% with hypokinesis of septum, distal lateral femoral distal posterior, distal anterior and apical walls  2.  Chronic systolic and diastolic congestive heart failure -She appears compensated, will continue her home regimen with Lasix 20 mg by mouth daily and amiloride 10 mg 3 times weekly -Transthoracic echocardiogram performed on 05/20/2014 showing ejection fraction of 35%. Prior to this her last transthoracic echocardiogram was done in 2013 which showed an ejection fraction of 25-30% with grade 3 diastolic dysfunction -Status post AICD placement  3.  Parkinson's disease. -Continue carbidopa/levodopa one tablet twice daily  4.  Hypertension -Blood pressures falling low at 108/92 today, will hold metoprolol therapy with goal of increasing blood pressures to favor cerebral perfusion in setting of CVA  Code Status: Full Code Family Communication: A Spoke with her granddaughters who were present at bedside Disposition Plan: Pending PT/OT/SLP consultation   Consultants:  Neurology  Procedures:  Carotid Dopplers performed on 05/20/2014 impression: 1-39% ICA stenosis  Transthoracic echocardiogram performed on 05/20/2014 impression: LVEF 35%   HPI/Subjective: Patient is a pleasant 80 year old female with a past medical history of  atrial fibrillation, stage III chronic kidney disease, Parkinson's disease who was admitted to medicine service on 05/19/2014. She presented with complaints of dysarthria that started the day prior to admission. She was initially worked up with a CT scan of brain without contrast which showed small vessel ischemic disease, left frontal lobe encephalomalacia compatible with previous cortical infarct. She was admitted to the medicine service and placed on CVA protocol. Patient seen and evaluated by neurology. Pacemaker implant precluded utilization of MRI. She had a repeat CT scan of brain on 05/20/2014 which showed involving acute left MCA territory infarct.   Objective: Filed Vitals:   05/20/14 1353  BP: 108/92  Pulse: 67  Temp: 97.5 F (36.4 C)  Resp: 16    Intake/Output Summary (Last 24 hours) at 05/20/14 1606 Last data filed at 05/20/14 1352  Gross per 24 hour  Intake    940 ml  Output      0 ml  Net    940 ml   Filed Weights   05/19/14 2000  Weight: 53.1 kg (117 lb 1 oz)    Exam:   General:  Patient is dysarthric, awake alert oriented, following commands  Cardiovascular: Regular rate rhythm normal S1-S2  Respiratory: Clear to auscultation bilaterally no wheezing rhonchi  Abdomen: Soft nontender nondistended  Musculoskeletal: No edema  Neurological: Patient is dysarthric, has 5 out of 5 muscle strength to bilateral upper and lower extremities, finger to nose intact. Resting tremor present. Upper extremity rigidity noted  Data Reviewed: Basic Metabolic Panel:  Recent Labs Lab 05/19/14 1201 05/19/14 1203 05/19/14 1655 05/20/14 0455  NA 141  --   --  141  K 4.0  --   --  4.1  CL 100  --   --  104  CO2 28  --   --  26  GLUCOSE 94  --   --  87  BUN 35*  --   --  36*  CREATININE 1.35*  --   --  1.36*  CALCIUM 9.6  --   --  8.9  MG  --  1.8 1.7  --    Liver Function Tests:  Recent Labs Lab 05/19/14 1201  AST 28  ALT 20  ALKPHOS 101  BILITOT 0.8  PROT 7.2   ALBUMIN 4.4   No results found for this basename: LIPASE, AMYLASE,  in the last 168 hours No results found for this basename: AMMONIA,  in the last 168 hours CBC:  Recent Labs Lab 05/19/14 1201 05/20/14 0455  WBC 5.5 4.2  NEUTROABS 3.9  --   HGB 11.3* 10.5*  HCT 34.5* 31.8*  MCV 99.1 98.5  PLT 139* 122*   Cardiac Enzymes: No results found for this basename: CKTOTAL, CKMB, CKMBINDEX, TROPONINI,  in the last 168 hours BNP (last 3 results) No results found for this basename: PROBNP,  in the last 8760 hours CBG:  Recent Labs Lab 05/19/14 1202  GLUCAP 97    No results found for this or any previous visit (from the past 240 hour(s)).   Studies: Dg Chest 2 View  05/19/2014   CLINICAL DATA:  Slurred speech, trembling, suspect acute stroke, initial evaluation personal history congestive heart failure  EXAM: CHEST  2 VIEW  COMPARISON:  04/05/2012  FINDINGS: Moderate to severe cardiac enlargement stable. Cardiac pacer stable. Mild vascular congestion. No edema consolidation or effusion.  Multilevel kyphoplasty in the lumbar spine.  IMPRESSION: No acute finding   Electronically Signed   By: Esperanza Heir M.D.   On: 05/19/2014 20:22   Ct Head Wo Contrast  05/20/2014   CLINICAL DATA:  Fluent aphasia and right visual field cut for 2 days. Follow-up prior head CT to confirm suspected stroke.  EXAM: CT HEAD WITHOUT CONTRAST  TECHNIQUE: Contiguous axial images were obtained from the base of the skull through the vertex without intravenous contrast.  COMPARISON:  05/19/2014  FINDINGS: There is a small region of progressive hypoattenuation involving the posterior left insula extending posteriorly into the left temporoparietal region involving both white matter and cortex. There is no evidence of intracranial hemorrhage, mass, midline shift, or extra-axial fluid collection. Left frontal encephalomalacia related to old infarct is again seen. There is mild cerebral atrophy. Patchy and confluent  hypodensities in the cerebral white matter are compatible with moderate chronic small vessel ischemic disease.  Orbits are unremarkable. Visualized paranasal sinuses and mastoid air cells are clear. Diffuse carotid siphon calcification is noted.  IMPRESSION: Evolving, acute left MCA infarct involving insula and temporoparietal junction. No intracranial hemorrhage.   Electronically Signed   By: Sebastian Ache   On: 05/20/2014 13:38   Ct Head (brain) Wo Contrast  05/19/2014   CLINICAL DATA:  Not feeling well since yesterday. Difficulty speaking and answering questions. Numbness to left leg.  EXAM: CT HEAD WITHOUT CONTRAST  TECHNIQUE: Contiguous axial images were obtained from the base of the skull through the vertex without intravenous contrast.  COMPARISON:  08/31/2011  FINDINGS: There is diffuse low attenuation within the subcortical and periventricular white matter compatible with chronic microvascular disease. Left frontal lobe encephalomalacia is identified compatible with previous infarct. Prominence of the sulci and ventricles are identified consistent with brain atrophy. No evidence for acute brain infarct, hemorrhage or mass.  The paranasal sinuses are clear. The mastoid air cells are clear. The skull appears intact.  IMPRESSION: 1. Small vessel ischemic disease and brain  atrophy. 2. Left frontal lobe encephalomalacia compatible with previous cortical infarct. 3. No acute intracranial abnormalities noted.   Electronically Signed   By: Signa Kellaylor  Stroud M.D.   On: 05/19/2014 13:55    Scheduled Meds: . allopurinol  200 mg Oral Daily  . aMILoride  10 mg Oral Once per day on Mon Wed Fri  . aspirin  300 mg Rectal Daily   Or  . aspirin  325 mg Oral Daily  . carbidopa-levodopa  1 tablet Oral BID  . furosemide  20 mg Oral Daily  . heparin  5,000 Units Subcutaneous 3 times per day  . metoprolol tartrate  25 mg Oral BID  . pantoprazole  40 mg Oral Daily  . simvastatin  20 mg Oral q1800   Continuous  Infusions:   Principal Problem:   Stroke Active Problems:   A-fib   Dysarthria   CKD (chronic kidney disease) stage 3, GFR 30-59 ml/min   CVA (cerebral infarction)   Parkinson disease   Pacemaker    Time spent: 35 min    Jeralyn BennettZAMORA, Michaella Imai  [facility] Pager [phone]. If 7PM-7AM, please contact night-coverage at [email or web address], password [email or web address] 05/20/2014, 4:06 PM  LOS: 1 day

## 2014-05-20 NOTE — Evaluation (Addendum)
Occupational Therapy Evaluation Patient Details Name: [name] MRN: [id number] DOB: August 02, 1933 Today's Date: 05/20/2014    History of Present Illness 80 y.o. admitted with aphasia. CT revealed evolving, acute left MCA infarct involving insula and temporoparietal junction.   Clinical Impression   Pt admitted with above. Pt independent with ADLs, PTA. Feel pt will benefit from acute OT to increase independence prior to d/c. Spoke with family member about d/c recommendations. Feel pt can go home if she has 24/7 assistance/supervision available.    Follow Up Recommendations  SNF;Supervision/Assistance - 24 hour    Equipment Recommendations  None recommended by OT    Recommendations for Other Services       Precautions / Restrictions Precautions Precautions: Fall Restrictions Weight Bearing Restrictions: No      Mobility Bed Mobility                  Transfers Overall transfer level: Needs assistance   Transfers: Sit to/from Stand;Stand Pivot Transfers Sit to Stand: Min assist Stand pivot transfers: Min assist            Balance                                            ADL Overall ADL's : Needs assistance/impaired Eating/Feeding: Set up;Supervision/ safety;Sitting   Grooming: Wash/dry face;Oral care;Applying deodorant;Min guard;Standing   Upper Body Bathing: Min guard;Standing   Lower Body Bathing: Min guard;Sit to/from stand   Upper Body Dressing : Set up;Supervision/safety;Sitting   Lower Body Dressing: Min guard;Sit to/from stand   Toilet Transfer: Minimal assistance;Stand-pivot;BSC (bed)   Toileting- Clothing Manipulation and Hygiene: Moderate assistance;Sit to/from stand;Sitting/lateral lean (pt in hurry to urinate)       Functional mobility during ADLs: Minimal assistance;Rolling walker General ADL Comments: Pt on EOB when OT arrived and had to urinate. Pt ambulated to sink to perform grooming. Spoke with pt's  family about d/c recommendations.     Vision  Pt wears glasses; Pt reports no change from baseline. Difficult to assess vision due to impaired communication/cognition?  Pt with inconsistency with visual field testing (missing on right side)-difficult to assess due to impaired communication                     Perception     Praxis      Pertinent Vitals/Pain Pain Assessment: No/denies pain     Hand Dominance Right   Extremity/Trunk Assessment Upper Extremity Assessment Upper Extremity Assessment: Generalized weakness   Lower Extremity Assessment Lower Extremity Assessment: Defer to PT evaluation     Communication Communication Communication: Expressive difficulties   Cognition Arousal/Alertness: Awake/alert Behavior During Therapy: WFL for tasks assessed/performed Overall Cognitive Status: Difficult to assess                     General Comments       Exercises       Shoulder Instructions      Home Living Family/patient expects to be discharged to:: Private residence Living Arrangements: Alone Available Help at Discharge: Other (Comment);Neighbor;Family (per PT eval, family willing to hire someone) Type of Home: House Home Access: Stairs to enter Entergy CorporationEntrance Stairs-Number of Steps: 1 Entrance Stairs-Rails: None Home Layout: One level     Bathroom Shower/Tub: Producer, television/film/videoWalk-in shower   Bathroom Toilet: Standard     Home Equipment: Environmental consultantWalker - 4 wheels;Cane -  single point;Bedside commode;Shower seat - built in   Additional Comments: Per daughter reports she has some word finding difficulty PTA, but not to this extent, otherwise very sharp.       Prior Functioning/Environment Level of Independence: Independent with assistive device(s)        Comments: uses a cane both inside and outside of the home, pt drives her trash down the road.      OT Diagnosis: Generalized weakness   OT Problem List: Impaired balance (sitting and/or standing);Decreased  strength;Decreased knowledge of use of DME or AE;Decreased knowledge of precautions;Impaired vision/perception   OT Treatment/Interventions: Self-care/ADL training;DME and/or AE instruction;Therapeutic activities;Patient/family education;Balance training;Cognitive remediation/compensation    OT Goals(Current goals can be found in the care plan section) Acute Rehab OT Goals Patient Stated Goal: go home OT Goal Formulation: With patient/family Time For Goal Achievement: 05/27/14 Potential to Achieve Goals: Good ADL Goals Pt Will Perform Lower Body Bathing: with modified independence;sit to/from stand Pt Will Perform Lower Body Dressing: with modified independence;sit to/from stand Pt Will Transfer to Toilet: with modified independence;ambulating Pt Will Perform Toileting - Clothing Manipulation and hygiene: with modified independence;sit to/from stand  OT Frequency: Min 2X/week   Barriers to D/C:            Co-evaluation              End of Session Equipment Utilized During Treatment: Gait belt;Rolling walker  Activity Tolerance: Patient tolerated treatment well Patient left: in bed;with call bell/phone within reach;with bed alarm set   Time: 1437-1510 OT Time Calculation (min): 33 min Charges:  OT General Charges $OT Visit: 1 Procedure OT Evaluation $Initial OT Evaluation Tier I: 1 Procedure OT Treatments $Self Care/Home Management : 8-22 mins G-CodesEarlie Raveling:    Kahley Leib L OTR/L [phone] 05/20/2014, 6:12 PM

## 2014-05-20 NOTE — Progress Notes (Signed)
UR complete.  Early Steel RN, MSN 

## 2014-05-20 NOTE — Progress Notes (Addendum)
STROKE TEAM PROGRESS NOTE   HISTORY [name] is a 80 y.o. female was last seen well prior to bedtime last night 05/18/2014 at 2000 who presents with aphasia. She has a history of Parkinson's disease treated with carbidopa levodopa 25-100 3 times a day. She was in her normal state of health yesterday evening, but when her daughter called her this morning 05/19/2014 she could not understand what she was sitting up the phone. Patient was not administered TPA secondary to delay in arrival. She was admitted for further evaluation and treatment.   SUBJECTIVE (INTERVAL HISTORY) No family is at the bedside.  Overall she feels her condition is stable from yesterday. She continues to have difficulty getting words out. She states she can understand what is being said to her.   OBJECTIVE Temp:  [97.8 F (36.6 C)-98.4 F (36.9 C)] 97.8 F (36.6 C) (10/19 0856) Pulse Rate:  [61-66] 61 (10/19 0856) Cardiac Rhythm:  [-]  Resp:  [14-20] 16 (10/19 0856) BP: (117-154)/(50-109) 129/83 mmHg (10/19 0856) SpO2:  [96 %-100 %] 99 % (10/19 0856) Weight:  [53.1 kg (117 lb 1 oz)] 53.1 kg (117 lb 1 oz) (10/18 2000)   Recent Labs Lab 05/19/14 1202  GLUCAP 97    Recent Labs Lab 05/19/14 1201 05/19/14 1203 05/19/14 1655 05/20/14 0455  NA 141  --   --  141  K 4.0  --   --  4.1  CL 100  --   --  104  CO2 28  --   --  26  GLUCOSE 94  --   --  87  BUN 35*  --   --  36*  CREATININE 1.35*  --   --  1.36*  CALCIUM 9.6  --   --  8.9  MG  --  1.8 1.7  --     Recent Labs Lab 05/19/14 1201  AST 28  ALT 20  ALKPHOS 101  BILITOT 0.8  PROT 7.2  ALBUMIN 4.4    Recent Labs Lab 05/19/14 1201 05/20/14 0455  WBC 5.5 4.2  NEUTROABS 3.9  --   HGB 11.3* 10.5*  HCT 34.5* 31.8*  MCV 99.1 98.5  PLT 139* 122*   No results found for this basename: CKTOTAL, CKMB, CKMBINDEX, TROPONINI,  in the last 168 hours  Recent Labs  05/19/14 1201  LABPROT 15.8*  INR 1.25   No results found for this  basename: COLORURINE, APPERANCEUR, LABSPEC, PHURINE, GLUCOSEU, HGBUR, BILIRUBINUR, KETONESUR, PROTEINUR, UROBILINOGEN, NITRITE, LEUKOCYTESUR,  in the last 72 hours     Component Value Date/Time   CHOL 130 05/20/2014 0455   TRIG 70 05/20/2014 0455   HDL 46 05/20/2014 0455   CHOLHDL 2.8 05/20/2014 0455   VLDL 14 05/20/2014 0455   LDLCALC 70 05/20/2014 0455   No results found for this basename: HGBA1C   No results found for this basename: labopia, cocainscrnur, labbenz, amphetmu, thcu, labbarb    No results found for this basename: ETH,  in the last 168 hours  Dg Chest 2 View 05/19/2014   No acute finding     Ct Head Wo Contrast 05/20/2014   Evolving, acute left MCA infarct involving insula and temporoparietal junction. No intracranial hemorrhage.    05/19/2014   1. Small vessel ischemic disease and brain atrophy. 2. Left frontal lobe encephalomalacia compatible with previous cortical infarct. 3. No acute intracranial abnormalities noted.     Carotid Doppler  No evidence of hemodynamically significant internal carotid artery stenosis. Vertebral artery  flow is antegrade.   2D Echocardiogram  EF 35% with with hypokinesis of the septum, distal lateral, distal posterior, distal anterior and apical walls; akinesis of the mid/distal inferior wall.  Atrial fibrillation.  no obvious source of embolus.    PHYSICAL EXAM Frail elderly caucasian lady not in distress. Afebrile. Head is nontraumatic. Neck is supple without bruit. Hearing is normal. Cardiac exam no murmur or gallop. Lungs are clear to auscultation. Distal pulses are well felt. Neurological Exam : Awake alert. Mild hypophonic dysarthria. Mild nonfluent speech with some word finding difficulty. Follows one and two-step commands well. Extraocular movements are full range without nystagmus. Fundi were nonvisualized. Visual fields are adequate. Positive glabellar tap. Decreased facial expression. Motor system exam reveals no upper or lower  extremity drift. Testing upper extremity hand tremor which improved with position holding. Bilateral mild cogwheel rigidity present. Mild stooped posture. For focal extremity weakness. Coordination is slow but accurate. Sensation intact. Gait was not tested. ASSESSMENT/PLAN [name] is a 80 y.o. female with history of Parkinson's disease presenting with expressive aphasia. She did not receive IV t-PA due to delay in arrival.   Stroke:  Dominant left MCA infarct - insula and temporoparietal junction embolic secondary to known atrial fibrillation  MRI  /MRA  Pacer  Repeat CT confirms acute left MCA infarct  Carotid Doppler  No significant stenosis   2D Echo  No source of embolus   Check TCD  LDL 70 goal < 70. Add low dose statin.  HgbA1c pending  Heparin 5000 units sq tid for VTE prophylaxis  Cardiac thin liquids  Bedrest. Okay to be out of bed with assistance  aspirin 81 mg orally every day prior to admission, now on aspirin 325 mg orally every day. Given atrial fibrillation as the cause of her stroke, we'll need to readdress anticoagulation.  Ongoing aggressive risk factor management  Resultant expressive aphasia   Therapy recommendations:  pending   Disposition:  pending   Atrial Fibrillation  Home meds:  No anticoagulation for the past 1 year.   Discharge 09/02/2011 with diagnosis of Coumadin-related coagulopathy with INR of 6.3  Primary cardiologist is Dr. Katrinka BlazingSmith. Anticoagulation deferred to him by Dr. Graciela HusbandsKlein in 08/13/2013 note   Reconsider anticoagulation at discharged   Hypertension  Stable  Hyperlipidemia  Home meds:  none  LDL 70, goal < 70  Add low dose statin  Continue statin at discharge  Other Stroke Risk Factors Advanced age   Family hx stroke (sister)  Other Active Problems  Chronic systolic and diastolic congestive heart failure  Parkinson's disease  CKD, stage III  Pacer with generator replacement 2004  Hospital day #  1  SHARON BIBY, MSN, RN, ANVP-BC, ANP-BC, Lawernce IonGNP-BC [facility] Pager: [phone] 05/20/2014 3:26 PM   I have personally examined this patient, reviewed notes, independently viewed imaging studies, participated in medical decision making and plan of care. I have made any additions or clarifications directly to the above note. Agree with note above.   Delia HeadyPramod Erionna Strum, MD Medical Director [facility] Pager: [phone] 05/20/2014 4:14 PM    To contact Stroke Continuity provider, please refer to [email or web address]. After hours, contact General Neurology

## 2014-05-21 LAB — BASIC METABOLIC PANEL
Anion gap: 16 — ABNORMAL HIGH (ref 5–15)
BUN: 37 mg/dL — ABNORMAL HIGH (ref 6–23)
CO2: 22 mEq/L (ref 19–32)
Calcium: 9.1 mg/dL (ref 8.4–10.5)
Chloride: 101 mEq/L (ref 96–112)
Creatinine, Ser: 1.45 mg/dL — ABNORMAL HIGH (ref 0.50–1.10)
GFR calc Af Amer: 39 mL/min — ABNORMAL LOW (ref >90)
GFR calc non Af Amer: 33 mL/min — ABNORMAL LOW (ref >90)
GLUCOSE: 105 mg/dL — AB (ref 70–99)
POTASSIUM: 4.6 meq/L (ref 3.7–5.3)
SODIUM: 139 meq/L (ref 137–147)

## 2014-05-21 LAB — CBC
HCT: 32.8 % — ABNORMAL LOW (ref 36.0–46.0)
Hemoglobin: 10.9 g/dL — ABNORMAL LOW (ref 12.0–15.0)
MCH: 33.2 pg (ref 26.0–34.0)
MCHC: 33.2 g/dL (ref 30.0–36.0)
MCV: 100 fL (ref 78.0–100.0)
PLATELETS: 120 10*3/uL — AB (ref 150–400)
RBC: 3.28 MIL/uL — AB (ref 3.87–5.11)
RDW: 14.2 % (ref 11.5–15.5)
WBC: 9.8 10*3/uL (ref 4.0–10.5)

## 2014-05-21 MED ORDER — APIXABAN 2.5 MG PO TABS
2.5000 mg | ORAL_TABLET | Freq: Two times a day (BID) | ORAL | Status: DC
  Administered 2014-05-21 – 2014-05-22 (×2): 2.5 mg via ORAL
  Filled 2014-05-21 (×2): qty 1

## 2014-05-21 MED ORDER — ACETAMINOPHEN 325 MG PO TABS
650.0000 mg | ORAL_TABLET | Freq: Four times a day (QID) | ORAL | Status: DC | PRN
  Administered 2014-05-21 – 2014-05-22 (×2): 650 mg via ORAL
  Filled 2014-05-21 (×2): qty 2

## 2014-05-21 NOTE — Progress Notes (Signed)
TCD completed. 

## 2014-05-21 NOTE — Progress Notes (Signed)
SLP Cancellation Note  Patient Details Name: Samella ParrBetty C Grayson MRN: [id number] DOB: 01-Feb-1933   Cancelled treatment:       Reason Eval/Treat Not Completed: Other (comment) Pts chart screened by SLP. Given nature of order and pt dx, SLP will complete evaluation/treatment as soon as possible, which may be within 24-48 hours.    Gracen Southwell, Riley NearingBonnie Caroline 05/21/2014, 2:42 PM

## 2014-05-21 NOTE — Progress Notes (Signed)
05/21/2014  Just spoke with physician and social worker re: pt's d/c destination.  Physician reports that family has decided that they would like SNF placement for pt for rehab.  Daughter yesterday reported that family could not provide 24/7 assist at discharge and were willing to hire help, but I do not know that she knew how expensive this route would be.  Pt will need 24/7 assist and supervision due to both cognitive, language (expressive difficulties) and physical deficits.  So, if family is unable to afford to hire 24/7 assist, then the pt is appropriate for SNF placement at this time.  PT will change recommendation to SNF at discharge.    Thanks,  Rollene Rotundaebecca B. Briselda Naval, PT, DPT [phone]

## 2014-05-21 NOTE — Clinical Social Work Placement (Addendum)
Clinical Social Work Department CLINICAL SOCIAL WORK PLACEMENT NOTE 05/21/2014  Patient:  [name],Shawnita C  Account Number:  [id number] Admit date:  05/19/2014  Clinical Social Worker:  Gwynne EdingerYSHEKA Weiland Tomich, LCSWA  Date/time:  05/21/2014 02:15 PM  Clinical Social Work is seeking post-discharge placement for this patient at the following level of care:   SKILLED NURSING   (*CSW will update this form in Epic as items are completed)   05/21/2014  Patient/family provided with [facility] Department of Clinical Social Work's list of facilities offering this level of care within the geographic area requested by the patient (or if unable, by the patient's family).  05/21/2014  Patient/family informed of their freedom to choose among providers that offer the needed level of care, that participate in Medicare, Medicaid or managed care program needed by the patient, have an available bed and are willing to accept the patient.  05/21/2014  Patient/family informed of [facility]' ownership interest in [facility], as well as of the fact that they are under no obligation to receive care at this facility.  PASARR submitted to EDS on  PASARR number received on   FL2 transmitted to all facilities in geographic area requested by pt/family on  05/21/2014 FL2 transmitted to all facilities within larger geographic area on 05/21/2014  Patient informed that his/her managed care company has contracts with or will negotiate with  certain facilities, including the following:     Patient/family informed of bed offers received:  05/22/2014 [facility] Physician recommends and patient chooses bed at    Patient to be [facility] to [facility] on 05/22/2014   Patient to be transferred to facility by Alveda ReasonsAngie Cox, daughter Patient and family notified of transfer on 05/22/2014 Name of family member notified: Angie Cox and Terrace ArabiaLisa Haithcox, daughters    The  following physician request were entered in Epic:   Additional Comments: Pt's has an existing PASSAR [phone]   Reata Petrov, MSW, Theresia MajorsLCSWA [phone]

## 2014-05-21 NOTE — Clinical Social Work Psychosocial (Signed)
Clinical Social Work Department BRIEF PSYCHOSOCIAL ASSESSMENT 05/21/2014  Patient:  [name],[name]     Account Number:  [id number]     Admit date:  05/19/2014  Clinical Social Worker:  Bo McclintockBIBBS,Seana Underwood, LCSWA  Date/Time:  05/21/2014 02:07 PM  Referred by:  Physician  Date Referred:  05/21/2014 Referred for  SNF Placement   Other Referral:   Interview type:  Family Other interview type:   Pt is not orient; Daughter Hickman,Melinda Daughter [phone]   [phone]    PSYCHOSOCIAL DATA Living Status:  ALONE Admitted from facility:   Level of care:   Primary support name:  Melinda StanleyLisa Hickman Primary support relationship to patient:  CHILD, ADULT Degree of support available:   Strong    CURRENT CONCERNS  Other Concerns:    SOCIAL WORK ASSESSMENT / PLAN CSW contracted pt's daughter Melinda StanleyLisa. CSW introduce self and purpose of call. Melinda StanleyLisa reported that she and her sister discussed wanting SNF rehab for their mother. CSW and Melinda StanleyLisa discussed the geographic area in which the family is interest in selecting for rehab. Melinda StanleyLisa [place] SN[place]. Melinda StanleyLisa reported wanting the pt as close to home as possible. CSW provided Melinda StanleyLisa with contact information for further questions. CSW will continue to follow this pt and assist with discharge as needed.   Assessment/plan status:  [facility] to [facility] Other assessment/ plan:   Information/referral to community resources:   review SNF List    PATIENT'S/FAMILY'S RESPONSE TO PLAN OF CARE: Pt's daughters agreed with SNF placement.   Shandelle Borrelli, MSW, LCSWA [phone]

## 2014-05-21 NOTE — Progress Notes (Signed)
ANTICOAGULATION CONSULT NOTE - Initial Consult  Pharmacy Consult for Apixaban Indication: atrial fibrillation (non-valvular)  No Known Allergies Patient Measurements: Height: 5\' 2"  (157.5 cm) Weight: 117 lb 1 oz (53.1 kg) IBW/kg (Calculated) : 50.1 Vital Signs: Temp: 98.2 F (36.8 C) (10/20 0938) Temp Source: Oral (10/20 0938) BP: 170/75 mmHg (10/20 0938) Pulse Rate: 66 (10/20 0938) Labs:  Recent Labs  05/19/14 1201 05/20/14 0455 05/21/14 0702  HGB 11.3* 10.5* 10.9*  HCT 34.5* 31.8* 32.8*  PLT 139* 122* 120*  APTT 32  --   --   LABPROT 15.8*  --   --   INR 1.25  --   --   CREATININE 1.35* 1.36* 1.45*    Estimated Creatinine Clearance: 24.9 ml/min (by C-G formula based on Cr of 1.45).   Medical History: Past Medical History  Diagnosis Date  . Coronary artery disease   . Hypertension   . Heart murmur   . Shortness of breath   . Dysrhythmia     atrial fib  . Arthritis   . Neuromuscular disorder     parkinsons  . CHF (congestive heart failure)   . Atrial fibrillation   . Gout   . Osteoporosis   . Hypomagnesemia     Hx.  . Mild memory disturbance   . Peptic ulcer disease   . Degenerative arthritis   . Thyroid goiter     resection  . Parkinson disease     Medications:  Scheduled:  . allopurinol  200 mg Oral Daily  . aMILoride  10 mg Oral Once per day on Mon Wed Fri  . aspirin  300 mg Rectal Daily   Or  . aspirin  325 mg Oral Daily  . carbidopa-levodopa  1 tablet Oral BID  . furosemide  20 mg Oral Daily  . heparin  5,000 Units Subcutaneous 3 times per day  . pantoprazole  40 mg Oral Daily  . simvastatin  20 mg Oral q1800   Assessment: 80 year old female brought from home to the ED on 05/19/14 with complaints of dysarthia and CT Head on 05/20/14 revealed an evolving acute left MCA embolic infarct thought to be secondary to atrial fibrillation per Neurology progress notes. Unable to do MRI due to pacer. No alteplase was given due to delay in arrival.   Pharmacy has been consulted to start Apixaban therapy for nonvalvular atrial fibrillation and CVA.   Her past medication history is significant for atrial fibrillation and CHF, CKD stage III, Parkinson's disease, and history of chronic Coumadin up to 1 year ago when it was discontinued due to recurrent falls.   Neurology's progress note from 05/21/14 notes concern of fall risk versus stroke risk but have determined that patient should be started on therapy and this was discussed with the patient.   CBC: H/H are stable since admission, Platelets trended down from 139 to 120.  LFTs are within normal limits. SCR has risen slightly from 1.35 to 1.45 since admission with estimated CrCl ~25 mL/min. *This may increase the patient's bleeding risk Weight is 53.1 kg (documented 05/19/14).   Spoke with Annie MainSharon Biby, MSN, RN, ANVP-BC, ANP-BC, GNP-BC for Stroke team tonight. Due to patient's weight of < 60kg and that she will be 80 years old in less than 2 months, she agreed that the 2.5mg  dose was more appropriate for the patient.   Goal of Therapy:  Monitor platelets by anticoagulation protocol: Yes   Plan:  1. Apixaban 2.5mg  po BID (lower dose as  patient is <60kg and borderline for age and SCr).  2. Discontinue sq heparin. 3. Monitor CBC and signs or symptoms of bleeding while on therapy.   Link SnufferJessica Emilo Gras, PharmD, BCPS Clinical Pharmacist [phone] 05/21/2014,4:04 PM

## 2014-05-21 NOTE — Progress Notes (Signed)
STROKE TEAM PROGRESS NOTE   HISTORY [name] is a 80 y.o. female was last seen well prior to bedtime last night 05/18/2014 at 2000 who presents with aphasia. She has a history of Parkinson's disease treated with carbidopa levodopa 25-100 3 times a day. She was in her normal state of health yesterday evening, but when her daughter called her this morning 05/19/2014 she could not understand what she was sitting up the phone. Patient was not administered TPA secondary to delay in arrival. She was admitted for further evaluation and treatment.   SUBJECTIVE (INTERVAL HISTORY) No family is at the bedside. Patient is up in the chair at the bedside.  OBJECTIVE Temp:  [97.4 F (36.3 C)-98.6 F (37 C)] 98.2 F (36.8 C) (10/20 0938) Pulse Rate:  [64-78] 66 (10/20 0938) Cardiac Rhythm:  [-] Ventricular paced (10/20 0800) Resp:  [16-24] 20 (10/20 0938) BP: (108-170)/(43-92) 170/75 mmHg (10/20 0938) SpO2:  [96 %-100 %] 98 % (10/20 0938)   Recent Labs Lab 05/19/14 1202  GLUCAP 97    Recent Labs Lab 05/19/14 1201 05/19/14 1203 05/19/14 1655 05/20/14 0455 05/21/14 0702  NA 141  --   --  141 139  K 4.0  --   --  4.1 4.6  CL 100  --   --  104 101  CO2 28  --   --  26 22  GLUCOSE 94  --   --  87 105*  BUN 35*  --   --  36* 37*  CREATININE 1.35*  --   --  1.36* 1.45*  CALCIUM 9.6  --   --  8.9 9.1  MG  --  1.8 1.7  --   --     Recent Labs Lab 05/19/14 1201  AST 28  ALT 20  ALKPHOS 101  BILITOT 0.8  PROT 7.2  ALBUMIN 4.4    Recent Labs Lab 05/19/14 1201 05/20/14 0455 05/21/14 0702  WBC 5.5 4.2 9.8  NEUTROABS 3.9  --   --   HGB 11.3* 10.5* 10.9*  HCT 34.5* 31.8* 32.8*  MCV 99.1 98.5 100.0  PLT 139* 122* 120*   No results found for this basename: CKTOTAL, CKMB, CKMBINDEX, TROPONINI,  in the last 168 hours  Recent Labs  05/19/14 1201  LABPROT 15.8*  INR 1.25   No results found for this basename: COLORURINE, APPERANCEUR, LABSPEC, PHURINE, GLUCOSEU, HGBUR,  BILIRUBINUR, KETONESUR, PROTEINUR, UROBILINOGEN, NITRITE, LEUKOCYTESUR,  in the last 72 hours     Component Value Date/Time   CHOL 130 05/20/2014 0455   TRIG 70 05/20/2014 0455   HDL 46 05/20/2014 0455   CHOLHDL 2.8 05/20/2014 0455   VLDL 14 05/20/2014 0455   LDLCALC 70 05/20/2014 0455   Lab Results  Component Value Date   HGBA1C 6.0* 05/20/2014   No results found for this basename: labopia,  cocainscrnur,  labbenz,  amphetmu,  thcu,  labbarb    No results found for this basename: ETH,  in the last 168 hours  Dg Chest 2 View 05/19/2014   No acute finding     Ct Head Wo Contrast 05/20/2014   Evolving, acute left MCA infarct involving insula and temporoparietal junction. No intracranial hemorrhage.    05/19/2014   1. Small vessel ischemic disease and brain atrophy. 2. Left frontal lobe encephalomalacia compatible with previous cortical infarct. 3. No acute intracranial abnormalities noted.     Carotid Doppler  No evidence of hemodynamically significant internal carotid artery stenosis. Vertebral artery flow is  antegrade.   2D Echocardiogram  EF 35% with with hypokinesis of the septum, distal lateral, distal posterior, distal anterior and apical walls; akinesis of the mid/distal inferior wall.  Atrial fibrillation.  no obvious source of embolus.    PHYSICAL EXAM Frail elderly caucasian lady not in distress. Afebrile. Head is nontraumatic. Neck is supple without bruit. Hearing is normal. Cardiac exam no murmur or gallop. Lungs are clear to auscultation. Distal pulses are well felt. Neurological Exam : Awake alert. Mild hypophonic dysarthria. Mild nonfluent speech with some word finding difficulty. Follows one and two-step commands well. Extraocular movements are full range without nystagmus. Fundi were nonvisualized. Visual fields are adequate. Positive glabellar tap. Decreased facial expression. Motor system exam reveals no upper or lower extremity drift. Testing upper extremity hand  tremor which improved with position holding. Bilateral mild cogwheel rigidity present. Mild stooped posture. For focal extremity weakness. Coordination is slow but accurate. Sensation intact. Gait was not tested.   ASSESSMENT/PLAN [name] is a 80 y.o. female with history of Parkinson's disease presenting with expressive aphasia. She did not receive IV t-PA due to delay in arrival.   Stroke:  Dominant left MCA infarct - insula and temporoparietal junction embolic secondary to known atrial fibrillation  MRI  /MRA  Pacer  Repeat CT confirms acute left MCA infarct  Carotid Doppler  No significant stenosis   2D Echo  No source of embolus   TCD  pending  LDL 70 goal < 70. Add low dose statin.  HgbA1c 6.0  Heparin 5000 units sq tid for VTE prophylaxis  Cardiac thin liquids  Bedrest. Okay to be out of bed with assistance  aspirin 81 mg orally every day prior to admission, now on aspirin 325 mg orally every day. Given atrial fibrillation as the cause of her stroke, we'll need to readdress anticoagulation. Likely  change to eliquis-I discussed risk benefit with patient and she wants to try Eliquis  Ongoing aggressive risk factor management  Resultant expressive aphasia   Therapy recommendations:  pending   Disposition:  pending   Atrial Fibrillation  Home meds:  No anticoagulation for the past 1 year.   Discharge 09/02/2011 with diagnosis of Coumadin-related coagulopathy with INR of 6.3  Primary cardiologist is Dr. Katrinka BlazingSmith. Anticoagulation deferred to him by Dr. Graciela HusbandsKlein in 08/13/2013 note   Reconsider anticoagulation at discharged but need to weigh fall risk versus stroke risk but given new data on less bleeding with NOACs prefer Eliquis   Hypertension  Stable  Hyperlipidemia  Home meds:  none  LDL 70, goal < 70  Add low dose statin  Continue statin at discharge  Other Stroke Risk Factors Advanced age   Family hx stroke (sister)  Other Active  Problems  Chronic systolic and diastolic congestive heart failure  Parkinson's disease  CKD, stage III  Pacer with generator replacement 2004  Hospital day # 2  Annie MainSHARON BIBY, MSN, RN, ANVP-BC, ANP-BC, GNP-BC [facility] Pager: [phone] 05/21/2014 1:40 PM  I have personally examined this patient, reviewed notes, independently viewed imaging studies, participated in medical decision making and plan of care. I have made any additions or clarifications directly to the above note. Agree with note above.   Delia HeadyPramod Sethi, MD Medical Director [facility] Pager: [phone] 05/21/2014 1:40 PM    To contact Stroke Continuity provider, please refer to [email or web address]. After hours, contact General Neurology

## 2014-05-21 NOTE — Progress Notes (Signed)
Physical Therapy Treatment Patient Details Name: Melinda ParrBetty C Garry MRN: [id number] DOB: Jun 12, 1933 Today's Date: 05/21/2014    History of Present Illness 80 y.o. female admitted to [facility] on 05/19/14 with aphasia. CT revealed evolving, acute left MCA infarct involving insula and temporoparietal junction.  Pt cannot have MRI due to pacemaker.  Pt with other significant PMHx of CAD, HTN, SOB, CHF, Parkinson's, A-fib, gout, osteoporosis, mild memory disturbance, and kyphoplasty.      PT Comments    Pt is progressing well with her mobility, but shows signs of either a visual deficit or a right side inattention as she had difficulty scanning to the right and kept running her RW into things on the right side.  Pt's family has decided that they would like to pursue SNF placement for therapy at discharge as they cannot arrange for someone to be there 24/7 and the pt is not safe to be home alone at any time. PT will continue to follow acutely.   Follow Up Recommendations  SNF     Equipment Recommendations  None recommended by PT    Recommendations for Other Services   NA     Precautions / Restrictions Precautions Precautions: Fall    Mobility   Transfers Overall transfer level: Needs assistance Equipment used: Rolling walker (2 wheeled) Transfers: Sit to/from Stand Sit to Stand: Min assist         General transfer comment: Min assist to support trunk and stabilize RW during transitions.   Ambulation/Gait Ambulation/Gait assistance: Min guard Ambulation Distance (Feet): 180 Feet Assistive device: Rolling walker (2 wheeled) Gait Pattern/deviations: Step-through pattern;Shuffle;Trunk flexed Gait velocity: 1.00 ft/sec Gait velocity interpretation: <1.8 ft/sec, indicative of risk for recurrent falls General Gait Details: Pt with shuffling gait pattern and pre morbid flexed trunk.  Pt does fatigue by the end of the walk and has issues with steering the RW into obstacles (primarily on the  right side.  This made me think that there is something going on with her vision.    Stairs Stairs: Yes Stairs assistance: Min guard Stair Management: Two rails;Step to pattern;Forwards Number of Stairs: 2 General stair comments: Manual assist to find right railing (pt not even looking with her eyes that way).  Min guard assist for safety on the stairs.  Pt self selected step to pattern.    Modified Rankin (Stroke Patients Only) Pre-Morbid Rankin Score: Slight disability Modified Rankin: Moderately severe disability     Balance Overall balance assessment: Needs assistance Sitting-balance support: Feet supported;Bilateral upper extremity supported Sitting balance-Leahy Scale: Fair Sitting balance - Comments: uses arms to pull to sitting in recliner chair Postural control: Posterior lean Standing balance support: No upper extremity supported;Bilateral upper extremity supported Standing balance-Leahy Scale: Fair Standing balance comment: Pt can stand statically without upper extremity support (once there), but anything dynamic she needs one if not two hands supported.  Pt prefomed her own peri care after having a BM in the bathroom and it took a significant amount of time and she was unable to get herself completely clean.                     Cognition Arousal/Alertness: Awake/alert Behavior During Therapy: WFL for tasks assessed/performed Overall Cognitive Status: Difficult to assess                         General Comments General comments (skin integrity, edema, etc.): I looked at her vision a little more closely  and it almost seems as if she has some right sided inattention.  I was having her tell me numbers in her right periphery and she 50% of the time could not identify them or even make eye contact with my hand on that side.  Then, after she did make contact, she was able to identify them 100%.  Her peripherial vision seemed intact when I tested it, though (tell  me when you see my finger).      Pertinent Vitals/Pain Pain Assessment: No/denies pain           PT Goals (current goals can now be found in the care plan section) Acute Rehab PT Goals Patient Stated Goal: go home Progress towards PT goals: Progressing toward goals    Frequency  Min 3X/week    PT Plan Discharge plan needs to be updated;Frequency needs to be updated       End of Session Equipment Utilized During Treatment: Gait belt Activity Tolerance: Patient tolerated treatment well Patient left: in chair;with call bell/phone within reach;with chair alarm set;with family/visitor present     Time: [id number] PT Time Calculation (min): 22 min  Charges:  $Gait Training: 8-22 mins                      Zayna Toste B. Kel Senn, PT, DPT [phone]    05/21/2014, 5:04 PM

## 2014-05-21 NOTE — Progress Notes (Addendum)
[facility] PROGRESS NOTE  [name] JXB:[id number] DOB: 05-11-1933 DOA: 05/19/2014 PCP: Ailene Ravel, MD  Interim Summary Patient is a pleasant 80 year old female with a past medical history of atrial fibrillation, stage III chronic kidney disease, Parkinson's disease who was admitted to medicine service on 05/19/2014. She presented with complaints of dysarthria that started the day prior to admission. She was initially worked up with a CT scan of brain without contrast which showed small vessel ischemic disease, left frontal lobe encephalomalacia compatible with previous cortical infarct. She was admitted to the medicine service and placed on CVA protocol. Patient seen and evaluated by neurology. Pacemaker implant precluded utilization of MRI. She had a repeat CT scan of brain on 05/20/2014 which showed involving acute left MCA territory infarct. She has a history of atrial fibrillation as I suspect this may have been thromboembolic since she had not been on anticoagulation therapy. I spoke to her cardiologist Dr. Katrinka Blazing who recommended discharging her on anticoagulation therapy with Eliquis.    Physical therapy evaluated patient, found to be appropriate for SNF placement. Social work consulted, referral was to skilled nursing facilities. Awaiting placement.  Assessment/Plan: 1. Acute MCA territory CVA -Patient presenting with dysarthria, word substitution and difficulty getting words out. She has a history of atrial fibrillation and had not been on anticoagulation therapy -Her repeat CT scan of brain without contrast revealing an acute left MCA territory -Patient on aspirin 325 mg by mouth daily -Neurology consulted, will consider anticoagulation at discharge -Physical therapy, occupational therapy and speech pathology consultation. -Transthoracic echocardiogram showing ejection fraction of 35% with hypokinesis of septum, distal lateral femoral distal posterior, distal anterior and  apical walls -Spoke with her cardiologist Dr. Katrinka Blazing who recommended discharging her on anticoagulation. Eliquis has been ordered.   2.  Chronic systolic and diastolic congestive heart failure -She appears compensated, will continue her home regimen with Lasix 20 mg by mouth daily and amiloride 10 mg 3 times weekly -Transthoracic echocardiogram performed on 05/20/2014 showing ejection fraction of 35%. Prior to this her last transthoracic echocardiogram was done in 2013 which showed an ejection fraction of 25-30% with grade 3 diastolic dysfunction -Status post AICD placement  3.  History of paroxysmal atrial fibrillation -Currently in sinus rhythm  -Will continue metoprolol 25 mg by mouth twice a day, this dose was decreased given concerns for precipitating hypotension in setting of CVA -As mentioned above, discussed case with Dr. Katrinka Blazing recommended discharging her on anticoagulation Eliquis.   4.  Parkinson's disease. -Continue carbidopa/levodopa one tablet twice daily  5.  Hypertension -Continue metoprolol 25 mg by mouth twice a day  Code Status: Full Code Family Communication: A Spoke with her daughter who is agreeable to SNF placement Disposition Plan: Plan to discharge to SNF   Consultants:  Neurology  Procedures:  Carotid Dopplers performed on 05/20/2014 impression: 1-39% ICA stenosis  Transthoracic echocardiogram performed on 05/20/2014 impression: LVEF 35%   HPI/Subjective: Patient remained stable, remains dysarthric, tolerating by mouth intake  Objective: Filed Vitals:   05/21/14 0938  BP: 170/75  Pulse: 66  Temp: 98.2 F (36.8 C)  Resp: 20    Intake/Output Summary (Last 24 hours) at 05/21/14 1636 Last data filed at 05/21/14 1233  Gross per 24 hour  Intake    480 ml  Output      0 ml  Net    480 ml   Filed Weights   05/19/14 2000  Weight: 53.1 kg (117 lb 1 oz)    Exam:  General:  Patient is dysarthric, awake alert oriented, following  commands  Cardiovascular: Regular rate rhythm normal S1-S2  Respiratory: Clear to auscultation bilaterally no wheezing rhonchi  Abdomen: Soft nontender nondistended  Musculoskeletal: No edema  Neurological: Patient is dysarthric, has 5 out of 5 muscle strength to bilateral upper and lower extremities, finger to nose intact. Resting tremor present. Upper extremity rigidity noted  Data Reviewed: Basic Metabolic Panel:  Recent Labs Lab 05/19/14 1201 05/19/14 1203 05/19/14 1655 05/20/14 0455 05/21/14 0702  NA 141  --   --  141 139  K 4.0  --   --  4.1 4.6  CL 100  --   --  104 101  CO2 28  --   --  26 22  GLUCOSE 94  --   --  87 105*  BUN 35*  --   --  36* 37*  CREATININE 1.35*  --   --  1.36* 1.45*  CALCIUM 9.6  --   --  8.9 9.1  MG  --  1.8 1.7  --   --    Liver Function Tests:  Recent Labs Lab 05/19/14 1201  AST 28  ALT 20  ALKPHOS 101  BILITOT 0.8  PROT 7.2  ALBUMIN 4.4   No results found for this basename: LIPASE, AMYLASE,  in the last 168 hours No results found for this basename: AMMONIA,  in the last 168 hours CBC:  Recent Labs Lab 05/19/14 1201 05/20/14 0455 05/21/14 0702  WBC 5.5 4.2 9.8  NEUTROABS 3.9  --   --   HGB 11.3* 10.5* 10.9*  HCT 34.5* 31.8* 32.8*  MCV 99.1 98.5 100.0  PLT 139* 122* 120*   Cardiac Enzymes: No results found for this basename: CKTOTAL, CKMB, CKMBINDEX, TROPONINI,  in the last 168 hours BNP (last 3 results) No results found for this basename: PROBNP,  in the last 8760 hours CBG:  Recent Labs Lab 05/19/14 1202  GLUCAP 97    No results found for this or any previous visit (from the past 240 hour(s)).   Studies: Dg Chest 2 View  05/19/2014   CLINICAL DATA:  Slurred speech, trembling, suspect acute stroke, initial evaluation personal history congestive heart failure  EXAM: CHEST  2 VIEW  COMPARISON:  04/05/2012  FINDINGS: Moderate to severe cardiac enlargement stable. Cardiac pacer stable. Mild vascular congestion.  No edema consolidation or effusion.  Multilevel kyphoplasty in the lumbar spine.  IMPRESSION: No acute finding   Electronically Signed   By: Esperanza Heiraymond  Rubner M.D.   On: 05/19/2014 20:22   Ct Head Wo Contrast  05/20/2014   CLINICAL DATA:  Fluent aphasia and right visual field cut for 2 days. Follow-up prior head CT to confirm suspected stroke.  EXAM: CT HEAD WITHOUT CONTRAST  TECHNIQUE: Contiguous axial images were obtained from the base of the skull through the vertex without intravenous contrast.  COMPARISON:  05/19/2014  FINDINGS: There is a small region of progressive hypoattenuation involving the posterior left insula extending posteriorly into the left temporoparietal region involving both white matter and cortex. There is no evidence of intracranial hemorrhage, mass, midline shift, or extra-axial fluid collection. Left frontal encephalomalacia related to old infarct is again seen. There is mild cerebral atrophy. Patchy and confluent hypodensities in the cerebral white matter are compatible with moderate chronic small vessel ischemic disease.  Orbits are unremarkable. Visualized paranasal sinuses and mastoid air cells are clear. Diffuse carotid siphon calcification is noted.  IMPRESSION: Evolving, acute left MCA infarct involving  insula and temporoparietal junction. No intracranial hemorrhage.   Electronically Signed   By: Sebastian AcheAllen  Grady   On: 05/20/2014 13:38    Scheduled Meds: . allopurinol  200 mg Oral Daily  . aMILoride  10 mg Oral Once per day on Mon Wed Fri  . apixaban  2.5 mg Oral BID  . aspirin  300 mg Rectal Daily   Or  . aspirin  325 mg Oral Daily  . carbidopa-levodopa  1 tablet Oral BID  . furosemide  20 mg Oral Daily  . pantoprazole  40 mg Oral Daily  . simvastatin  20 mg Oral q1800   Continuous Infusions:   Principal Problem:   Stroke Active Problems:   A-fib   Dysarthria   CKD (chronic kidney disease) stage 3, GFR 30-59 ml/min   CVA (cerebral infarction)   Parkinson  disease   Pacemaker    Time spent: 25 min    Jeralyn BennettZAMORA, Heyward Douthit  [facility] Pager [phone]. If 7PM-7AM, please contact night-coverage at [email or web address], password [email or web address] 05/21/2014, 4:36 PM  LOS: 2 days

## 2014-05-22 DIAGNOSIS — R269 Unspecified abnormalities of gait and mobility: Secondary | ICD-10-CM

## 2014-05-22 DIAGNOSIS — M109 Gout, unspecified: Secondary | ICD-10-CM

## 2014-05-22 DIAGNOSIS — N189 Chronic kidney disease, unspecified: Secondary | ICD-10-CM

## 2014-05-22 DIAGNOSIS — N179 Acute kidney failure, unspecified: Secondary | ICD-10-CM

## 2014-05-22 MED ORDER — METOPROLOL TARTRATE 25 MG PO TABS: 25.0000 mg | ORAL_TABLET | Freq: Two times a day (BID) | ORAL | Status: AC

## 2014-05-22 MED ORDER — APIXABAN 2.5 MG PO TABS
2.5000 mg | ORAL_TABLET | Freq: Two times a day (BID) | ORAL | Status: AC
Start: 2014-05-22 — End: ?

## 2014-05-22 MED ORDER — PANTOPRAZOLE SODIUM 40 MG PO TBEC: 40.0000 mg | DELAYED_RELEASE_TABLET | Freq: Every day | ORAL | Status: DC

## 2014-05-22 MED ORDER — SIMVASTATIN 20 MG PO TABS: 20.0000 mg | ORAL_TABLET | Freq: Every day | ORAL | Status: AC

## 2014-05-22 MED ORDER — SENNOSIDES-DOCUSATE SODIUM 8.6-50 MG PO TABS: 1.0000 | ORAL_TABLET | Freq: Every evening | ORAL | Status: AC | PRN

## 2014-05-22 NOTE — Progress Notes (Signed)
STROKE TEAM PROGRESS NOTE   HISTORY [name] is a 80 y.o. female was last seen well prior to bedtime last night 05/18/2014 at 2000 who presents with aphasia. She has a history of Parkinson's disease treated with carbidopa levodopa 25-100 3 times a day. She was in her normal state of health yesterday evening, but when her daughter called her this morning 05/19/2014 she could not understand what she was sitting up the phone. Patient was not administered TPA secondary to delay in arrival. She was admitted for further evaluation and treatment.   SUBJECTIVE (INTERVAL HISTORY) Patient up in chair at the bedside.   OBJECTIVE Temp:  [97.7 F (36.5 C)-98.6 F (37 C)] 97.8 F (36.6 C) (10/21 1115) Pulse Rate:  [64-65] 64 (10/21 1115) Cardiac Rhythm:  [-] Ventricular paced (10/20 2000) Resp:  [17-20] 17 (10/21 1115) BP: (121-147)/(51-70) 143/70 mmHg (10/21 1115) SpO2:  [96 %-98 %] 98 % (10/21 1115)   Recent Labs Lab 05/19/14 1202  GLUCAP 97    Recent Labs Lab 05/19/14 1201 05/19/14 1203 05/19/14 1655 05/20/14 0455 05/21/14 0702  NA 141  --   --  141 139  K 4.0  --   --  4.1 4.6  CL 100  --   --  104 101  CO2 28  --   --  26 22  GLUCOSE 94  --   --  87 105*  BUN 35*  --   --  36* 37*  CREATININE 1.35*  --   --  1.36* 1.45*  CALCIUM 9.6  --   --  8.9 9.1  MG  --  1.8 1.7  --   --     Recent Labs Lab 05/19/14 1201  AST 28  ALT 20  ALKPHOS 101  BILITOT 0.8  PROT 7.2  ALBUMIN 4.4    Recent Labs Lab 05/19/14 1201 05/20/14 0455 05/21/14 0702  WBC 5.5 4.2 9.8  NEUTROABS 3.9  --   --   HGB 11.3* 10.5* 10.9*  HCT 34.5* 31.8* 32.8*  MCV 99.1 98.5 100.0  PLT 139* 122* 120*   No results found for this basename: CKTOTAL, CKMB, CKMBINDEX, TROPONINI,  in the last 168 hours No results found for this basename: LABPROT, INR,  in the last 72 hours No results found for this basename: COLORURINE, APPERANCEUR, LABSPEC, PHURINE, GLUCOSEU, HGBUR, BILIRUBINUR, KETONESUR,  PROTEINUR, UROBILINOGEN, NITRITE, LEUKOCYTESUR,  in the last 72 hours     Component Value Date/Time   CHOL 130 05/20/2014 0455   TRIG 70 05/20/2014 0455   HDL 46 05/20/2014 0455   CHOLHDL 2.8 05/20/2014 0455   VLDL 14 05/20/2014 0455   LDLCALC 70 05/20/2014 0455   Lab Results  Component Value Date   HGBA1C 6.0* 05/20/2014   No results found for this basename: labopia,  cocainscrnur,  labbenz,  amphetmu,  thcu,  labbarb    No results found for this basename: ETH,  in the last 168 hours  Dg Chest 2 View 05/19/2014   No acute finding     Ct Head Wo Contrast 05/20/2014   Evolving, acute left MCA infarct involving insula and temporoparietal junction. No intracranial hemorrhage.    05/19/2014   1. Small vessel ischemic disease and brain atrophy. 2. Left frontal lobe encephalomalacia compatible with previous cortical infarct. 3. No acute intracranial abnormalities noted.     Carotid Doppler  No evidence of hemodynamically significant internal carotid artery stenosis. Vertebral artery flow is antegrade.   2D Echocardiogram  EF  35% with with hypokinesis of the septum, distal lateral, distal posterior, distal anterior and apical walls; akinesis of the mid/distal inferior wall.  Atrial fibrillation.  no obvious source of embolus.   TCD  Low normal mean flow velocities throughout anterior and posterior cerebral circulations with globally elevated pulsatility indices suggest diffuse intracranial atherosclerosis.   PHYSICAL EXAM Frail elderly caucasian lady not in distress. Afebrile. Head is nontraumatic. Neck is supple without bruit. Hearing is normal. Cardiac exam no murmur or gallop. Lungs are clear to auscultation. Distal pulses are well felt. Neurological Exam : Awake alert. Mild hypophonic dysarthria. Mild nonfluent speech with some word finding difficulty. Follows one and two-step commands well. Extraocular movements are full range without nystagmus. Fundi were nonvisualized. Visual fields  are adequate. Positive glabellar tap. Decreased facial expression. Motor system exam reveals no upper or lower extremity drift. Testing upper extremity hand tremor which improved with position holding. Bilateral mild cogwheel rigidity present. Mild stooped posture. For focal extremity weakness. Coordination is slow but accurate. Sensation intact. Gait was not tested.   ASSESSMENT/PLAN [name] is a 80 y.o. female with history of Parkinson's disease presenting with expressive aphasia. She did not receive IV t-PA due to delay in arrival.   Stroke:  Dominant left MCA infarct - insula and temporoparietal junction embolic secondary to known atrial fibrillation  MRI  /MRA  Pacer  Repeat CT confirms acute left MCA infarct  Carotid Doppler  No significant stenosis   2D Echo  No source of embolus   TCD  Diffuse intracranial atherosclerosis  LDL 70 goal < 70. Add low dose statin.  HgbA1c 6.0  Heparin 5000 units sq tid for VTE prophylaxis  Cardiac thin liquids  Bedrest. Okay to be out of bed with assistance  aspirin 81 mg orally every day prior to admission, now on eliquis (apixaban). Aspirin d/c'd  Ongoing aggressive risk factor management  Resultant expressive aphasia   Therapy recommendations:  SNF  Disposition:  SNF  No further stroke workup indicated. Patient has a 10-15% risk of having another stroke over the next year, the highest risk is within 2 weeks of the most recent stroke/TIA (risk of having a stroke following a stroke or TIA is the same). Ongoing risk factor control by Primary Care Physician Stroke Service will sign off. Please call should any needs arise. [facility], order placed.   Atrial Fibrillation  Home meds:  No anticoagulation for the past 1 year.  Discharge 09/02/2011 with diagnosis of Coumadin-related coagulopathy with INR of 6.3  Primary cardiologist is Dr. Katrinka BlazingSmith. Anticoagulation  deferred to him by Dr. Graciela HusbandsKlein in 08/13/2013 note   Reconsider anticoagulation at discharged but need to weigh fall risk versus stroke risk but given new data on less bleeding with NOACs prefer Eliquis   Hypertension  Stable  Hyperlipidemia  Home meds:  none  LDL 70, goal < 70  Add low dose statin  Continue statin at discharge  Other Stroke Risk Factors Advanced age   Family hx stroke (sister)  Other Active Problems  Chronic systolic and diastolic congestive heart failure  Parkinson's disease  CKD, stage III  Pacer with generator replacement 2004  Hospital day # 3  SHARON BIBY, MSN, RN, ANVP-BC, ANP-BC, Lawernce IonGNP-BC [facility] Pager: [phone] 05/22/2014 12:52 PM   I have personally examined this patient, reviewed notes, independently viewed imaging studies, participated in medical decision making and plan of care. I have  made any additions or clarifications directly to the above note. Agree with note above.   Delia HeadyPramod Monseratt Ledin, MD Medical Director [facility] Pager: [phone] 05/22/2014 2:58 PM   To contact Stroke Continuity provider, please refer to [email or web address]. After hours, contact General Neurology

## 2014-05-22 NOTE — Discharge Instructions (Signed)

## 2014-05-22 NOTE — Progress Notes (Signed)
Patient left unit via wheelchair with daughter and belongings at side.

## 2014-05-22 NOTE — Discharge Summary (Signed)
Physician Discharge Summary  Patient ID: [name] MRN: [id number] DOB/AGE: 03-12-1933 80 y.o.  Admit date: 05/19/2014 Discharge date: 05/22/2014  Primary Care Physician:  Ailene Ravel, MD  Discharge Diagnoses:    . acute left MCA infarct  . chronic atrial fibrillation  Hypertension  Hyperlipidemia  . Dysarthria . CKD (chronic kidney disease) stage 3, GFR 30-59 ml/min . CVA (cerebral infarction) . Parkinson disease . Pacemaker  Consults: Neurology/stroke service  Recommendations for Outpatient Follow-up:  Risk factor modification to prevent future stroke  Anticoagulation started, placed on eliquis, fall precautions   Allergies:  No Known Allergies   Discharge Medications:   Medication List    STOP taking these medications       aspirin EC 81 MG tablet      TAKE these medications       acetaminophen 325 MG tablet  Commonly known as:  TYLENOL  Take 162.5 mg by mouth every 6 (six) hours as needed for headache.     allopurinol 100 MG tablet  Commonly known as:  ZYLOPRIM  Take 200 mg by mouth daily.     aMILoride 5 MG tablet  Commonly known as:  MIDAMOR  Take 10 mg by mouth 3 (three) times a week. Monday, Wednesday and Friday.     apixaban 2.5 MG Tabs tablet  Commonly known as:  ELIQUIS  Take 1 tablet (2.5 mg total) by mouth 2 (two) times daily.     Calcium + D3 600-200 MG-UNIT Tabs  Take 1 tablet by mouth daily.     carbidopa-levodopa 25-100 MG per tablet  Commonly known as:  SINEMET IR  Take 1 tablet by mouth 2 (two) times daily.     furosemide 20 MG tablet  Commonly known as:  LASIX  Take 1 tablet (20 mg total) by mouth daily.     IRON PO  Take 65 mg by mouth daily.     magnesium oxide 400 MG tablet  Commonly known as:  MAG-OX  Take 1,200 mg by mouth 2 (two) times daily.     metoprolol tartrate 25 MG tablet  Commonly known as:  LOPRESSOR  Take 1 tablet (25 mg total) by mouth 2 (two) times daily.     pantoprazole 40 MG tablet   Commonly known as:  PROTONIX  Take 1 tablet (40 mg total) by mouth daily.     PREVALITE 4 G packet  Generic drug:  cholestyramine light  Take 4 g by mouth daily.     senna-docusate 8.6-50 MG per tablet  Commonly known as:  Senokot-S  Take 1 tablet by mouth at bedtime as needed for moderate constipation.     simvastatin 20 MG tablet  Commonly known as:  ZOCOR  Take 1 tablet (20 mg total) by mouth at bedtime.         Brief H and P: For complete details please refer to admission H and P, but in brief [name] is a 80 y.o. female with a past medical history of atrial fibrillation, history of diastolic and systolic congestive heart failure, stage III chronic kidney disease, Parkinson's disease, reporting she had been on anticoagulation with Coumadin up to 1 year ago that was discontinued for recurrent falls, brought to the emergency department with complaints of dysarthria. Patient was unable to provide history and history was obtained from her daughter who was present at bedside. Prior to admission, Mrs. Kamer currently resided alone at home. Her daughter called her in evening between 7 and 8  PM noting that she had normal speech at the time and appeared to be functioning at her baseline level. She called her again this morning planning to take her to church however noticed a significant change, stating that she was "not making any sense." Her daughter went to her house immediately where she found her sitting on the couch. She noticed several things were spilled on the floor which had not been cleaned up, which was not like her mother. She noticed ongoing difficulty with speech and was brought to the hospital. In the emergency department she was seen and evaluated by neurology. A CT scan of brain showed left frontal lobe encephalomalacia compatible with previous cortical infarct, no acute intracranial abnormalities noted.    Hospital Course:  1. Acute MCA territory CVA: Patient presented  with dysarthria, word substitution and difficulty getting words out. She has a history of atrial fibrillation and had not been on anticoagulation therapy. Repeat CT scan of brain without contrast  revealed an acute left MCA territory. Patient was on aspirin prior to admission. Patient was followed closely by neurology/stroke service. PT and OT evaluation recommended skilled nursing facility. 2-D echo showed showed ejection fraction of 35% with hypokinesis of septum, distal lateral femoral distal posterior, distal anterior and apical walls. Dr. Vanessa Barbara spoke with her cardiologist, Dr. Katrinka Blazing who recommended anticoagulation and patient was started on Eliquis. Patient will continue statin. Carotid Doppler showed no significant stenosis. Speech therapy to follow at skilled nursing facility 2. Chronic systolic and diastolic congestive heart failure; appears compensated. Patient will continue home dose of diuretics. 2-D echo showed EF of 35%. Follow up outpatient with Dr. Katrinka Blazing in 2 weeks.   3. History of paroxysmal atrial fibrillation : currently in normal sinus rhythm  -Currently in sinus rhythm. Continue metoprolol 25 mg by mouth twice a day and anticoagulation Eliquis.  4. Parkinson's disease.  -Continue carbidopa/levodopa one tablet twice daily  5. Hypertension  -Continue metoprolol 25 mg by mouth twice a day       Day of Discharge BP 129/57  Pulse 65  Temp(Src) 98.6 F (37 C) (Oral)  Resp 18  Ht 5\' 2"  (1.575 m)  Wt 53.1 kg (117 lb 1 oz)  BMI 21.41 kg/m2  SpO2 97%  Physical Exam: General: Alert and awake oriented dysarthria, follows commands CVS: S1-S2 clear no murmur rubs or gallops Chest: clear to auscultation bilaterally, no wheezing rales or rhonchi Abdomen: soft nontender, nondistended, normal bowel sounds Extremities: no cyanosis, clubbing or edema noted bilaterally    The results of significant diagnostics from this hospitalization (including imaging, microbiology, ancillary and  laboratory) are listed below for reference.    LAB RESULTS: Basic Metabolic Panel:  Recent Labs Lab 05/19/14 1655 05/20/14 0455 05/21/14 0702  NA  --  141 139  K  --  4.1 4.6  CL  --  104 101  CO2  --  26 22  GLUCOSE  --  87 105*  BUN  --  36* 37*  CREATININE  --  1.36* 1.45*  CALCIUM  --  8.9 9.1  MG 1.7  --   --    Liver Function Tests:  Recent Labs Lab 05/19/14 1201  AST 28  ALT 20  ALKPHOS 101  BILITOT 0.8  PROT 7.2  ALBUMIN 4.4   No results found for this basename: LIPASE, AMYLASE,  in the last 168 hours No results found for this basename: AMMONIA,  in the last 168 hours CBC:  Recent Labs Lab 05/19/14 1201  05/20/14 0455 05/21/14 0702  WBC 5.5 4.2 9.8  NEUTROABS 3.9  --   --   HGB 11.3* 10.5* 10.9*  HCT 34.5* 31.8* 32.8*  MCV 99.1 98.5 100.0  PLT 139* 122* 120*   Cardiac Enzymes: No results found for this basename: CKTOTAL, CKMB, CKMBINDEX, TROPONINI,  in the last 168 hours BNP: No components found with this basename: POCBNP,  CBG:  Recent Labs Lab 05/19/14 1202  GLUCAP 97    Significant Diagnostic Studies:  Dg Chest 2 View  05/19/2014   CLINICAL DATA:  Slurred speech, trembling, suspect acute stroke, initial evaluation personal history congestive heart failure  EXAM: CHEST  2 VIEW  COMPARISON:  04/05/2012  FINDINGS: Moderate to severe cardiac enlargement stable. Cardiac pacer stable. Mild vascular congestion. No edema consolidation or effusion.  Multilevel kyphoplasty in the lumbar spine.  IMPRESSION: No acute finding   Electronically Signed   By: Esperanza Heiraymond  Rubner M.D.   On: 05/19/2014 20:22   Ct Head Wo Contrast  05/20/2014   CLINICAL DATA:  Fluent aphasia and right visual field cut for 2 days. Follow-up prior head CT to confirm suspected stroke.  EXAM: CT HEAD WITHOUT CONTRAST  TECHNIQUE: Contiguous axial images were obtained from the base of the skull through the vertex without intravenous contrast.  COMPARISON:  05/19/2014  FINDINGS: There is  a small region of progressive hypoattenuation involving the posterior left insula extending posteriorly into the left temporoparietal region involving both white matter and cortex. There is no evidence of intracranial hemorrhage, mass, midline shift, or extra-axial fluid collection. Left frontal encephalomalacia related to old infarct is again seen. There is mild cerebral atrophy. Patchy and confluent hypodensities in the cerebral white matter are compatible with moderate chronic small vessel ischemic disease.  Orbits are unremarkable. Visualized paranasal sinuses and mastoid air cells are clear. Diffuse carotid siphon calcification is noted.  IMPRESSION: Evolving, acute left MCA infarct involving insula and temporoparietal junction. No intracranial hemorrhage.   Electronically Signed   By: Sebastian AcheAllen  Grady   On: 05/20/2014 13:38   Ct Head (brain) Wo Contrast  05/19/2014   CLINICAL DATA:  Not feeling well since yesterday. Difficulty speaking and answering questions. Numbness to left leg.  EXAM: CT HEAD WITHOUT CONTRAST  TECHNIQUE: Contiguous axial images were obtained from the base of the skull through the vertex without intravenous contrast.  COMPARISON:  08/31/2011  FINDINGS: There is diffuse low attenuation within the subcortical and periventricular white matter compatible with chronic microvascular disease. Left frontal lobe encephalomalacia is identified compatible with previous infarct. Prominence of the sulci and ventricles are identified consistent with brain atrophy. No evidence for acute brain infarct, hemorrhage or mass.  The paranasal sinuses are clear. The mastoid air cells are clear. The skull appears intact.  IMPRESSION: 1. Small vessel ischemic disease and brain atrophy. 2. Left frontal lobe encephalomalacia compatible with previous cortical infarct. 3. No acute intracranial abnormalities noted.   Electronically Signed   By: Signa Kellaylor  Stroud M.D.   On: 05/19/2014 13:55   TCD's Summary: Low normal  mean flow velocities throughout anterior and posterior cerebral circulations with globally elevated pulsatility indices suggest diffuse intracranial atherosclerosis.  2D ECHO: Study Conclusions  - Left ventricle: LVEF is approximately 35% with hypokinesis of the septum, distal lateral, distal posterior, distal anterior and apical walls; akinesis of the mid/distal inferior wall. The cavity size was normal. Wall thickness was normal. - Mitral valve: There was moderate regurgitation. - Left atrium: The atrium was severely dilated. - Right atrium:  The atrium was severely dilated. - Tricuspid valve: There was moderate regurgitation. - Pulmonary arteries: PA peak pressure: 35 mm Hg (S).    Disposition and Follow-up: Discharge Instructions   Diet - low sodium heart healthy    Complete by:  As directed      Increase activity slowly    Complete by:  As directed             DISPOSITION: Skilled nursing facility  DIET: Heart healthy diet    DISCHARGE FOLLOW-UP Follow-up Information   Follow up with SETHI,PRAMOD, MD. Schedule an appointment as soon as possible for a visit in 2 months. (for hospital follow-up in stroke clinic)    Specialties:  Neurology, Radiology   Contact information:   [address] [place] [address] [phone]       Follow up with [facility],MAURA L, MD. Schedule an appointment as soon as possible for a visit in 2 weeks. (for hospital follow-up)    Specialty:  Family Medicine   Contact information:   Dr. Burnell BlanksMaura Hamrick [address] [place] [address] [phone]       Follow up with Lesleigh NoeSMITH III,HENRY W, MD. Schedule an appointment as soon as possible for a visit in 2 weeks. (for hospital follow-up)    Specialty:  Cardiology   Contact information:   [address] [place] [address] [phone]       Time spent on Discharge: 40 mins  Signed:   RAI,RIPUDEEP M.D. [facility] 05/22/2014,  11:10 AM Pager: [phone]

## 2014-05-22 NOTE — Progress Notes (Signed)
Patient is discharged from room 4N29 at this time. Alert and in stable condition. IV site d/c'd as well as tele. Report called to nurse Lenard GallowayMichelle Scheuing at [facility]. Awaitin daughter to pickup and transport.

## 2014-06-10 ENCOUNTER — Ambulatory Visit (INDEPENDENT_AMBULATORY_CARE_PROVIDER_SITE_OTHER): Payer: Medicare Other | Admitting: Physician Assistant

## 2014-06-10 ENCOUNTER — Encounter: Payer: Self-pay | Admitting: Physician Assistant

## 2014-06-10 VITALS — BP 116/60 | HR 65 | Ht 62.0 in | Wt 112.0 lb

## 2014-06-10 DIAGNOSIS — I5022 Chronic systolic (congestive) heart failure: Secondary | ICD-10-CM

## 2014-06-10 DIAGNOSIS — I4891 Unspecified atrial fibrillation: Secondary | ICD-10-CM

## 2014-06-10 DIAGNOSIS — N183 Chronic kidney disease, stage 3 unspecified: Secondary | ICD-10-CM

## 2014-06-10 DIAGNOSIS — N289 Disorder of kidney and ureter, unspecified: Secondary | ICD-10-CM

## 2014-06-10 LAB — BASIC METABOLIC PANEL
BUN: 37 mg/dL — AB (ref 6–23)
CALCIUM: 9.5 mg/dL (ref 8.4–10.5)
CHLORIDE: 102 meq/L (ref 96–112)
CO2: 25 mEq/L (ref 19–32)
CREATININE: 1.3 mg/dL — AB (ref 0.4–1.2)
GFR: 41.54 mL/min — ABNORMAL LOW (ref >60.00)
Glucose, Bld: 95 mg/dL (ref 70–99)
Potassium: 3.9 mEq/L (ref 3.5–5.1)
Sodium: 142 mEq/L (ref 135–145)

## 2014-06-10 NOTE — Assessment & Plan Note (Signed)
Patient suffered a recent stroke and is now on Eliquis. Her anticoagulants were stopped about a year ago because of frequent falls. She does have Parkinson's. Her daughter is a little worried about her being on anticoagulants as she is moving back home from the nursing home next week. She will have a caregiver with her. Overall she is progressing nicely from her stroke.

## 2014-06-10 NOTE — Assessment & Plan Note (Signed)
Patient has chronic atrial fibrillation, rate controlled

## 2014-06-10 NOTE — Progress Notes (Signed)
ZOX:WRUEHPI:This is a 80 year old female patient Dr. Garnette ScheuermannHank Hickman & Dr. Berton MountSteve Klein who was admitted to the hospital with acute left MCA infarct. She has history of chronic atrial fibrillation and anticoagulation was stopped approximate year ago because of frequent falls. She was started on Eliquis and transferred to nursing facility for rehabilitation. 2-D echo in the hospital shows EF 35% with hypokinesis of the septum, distal lateral and distal posterior and distal anterior and apical walls. Carotid Doppler showed no significant stenosis.Patient has history of chronic systolic and diastolic heart failure, hypertension, and Parkinson's disease.She also had a pacer implanted years ago with generator replacement in 2004.patient is device dependent for complete heart block status post AV ablation.her EF had deteriorated back in January 2015 possibly related to pacing either RV apical pacing or more rapid pacing versus CAD.  Patient comes in today accompanied by her daughter. She is working hard at rehabilitation but not walking much with her walker other than when she's working with PT. Her main trouble is her speech. She has trouble finding her words. She denies any chest pain, palpitations, dyspnea, dyspnea dizziness, or presyncope. She is very weak but is hoping to move back home with a caregiver in the next week.  No Known Allergies   Current Outpatient Prescriptions  Medication Sig Dispense Refill  . acetaminophen (TYLENOL) 325 MG tablet Take 162.5 mg by mouth every 6 (six) hours as needed for headache.     . allopurinol (ZYLOPRIM) 100 MG tablet Take 200 mg by mouth daily.     Marland Kitchen. aMILoride (MIDAMOR) 5 MG tablet Take 10 mg by mouth 3 (three) times a week. Monday, Wednesday and Friday.    Marland Kitchen. apixaban (ELIQUIS) 2.5 MG TABS tablet Take 1 tablet (2.5 mg total) by mouth 2 (two) times daily. 60 tablet 0  . Calcium Carb-Cholecalciferol (CALCIUM + D3) 600-200 MG-UNIT TABS Take 1 tablet by mouth daily.      . carbidopa-levodopa (SINEMET IR) 25-100 MG per tablet Take 1 tablet by mouth 2 (two) times daily.    . furosemide (LASIX) 20 MG tablet Take 1 tablet (20 mg total) by mouth daily. 30 tablet 1  . IRON PO Take 65 mg by mouth daily.    . magnesium oxide (MAG-OX) 400 MG tablet Take 1,200 mg by mouth 2 (two) times daily.    . metoprolol (LOPRESSOR) 25 MG tablet Take 1 tablet (25 mg total) by mouth 2 (two) times daily.    . pantoprazole (PROTONIX) 40 MG tablet Take 1 tablet (40 mg total) by mouth daily.    Marland Kitchen. PREVALITE 4 G packet Take 4 g by mouth daily.    Marland Kitchen. senna-docusate (SENOKOT-S) 8.6-50 MG per tablet Take 1 tablet by mouth at bedtime as needed for moderate constipation.    . simvastatin (ZOCOR) 20 MG tablet Take 1 tablet (20 mg total) by mouth at bedtime. 30 tablet 0   No current facility-administered medications for this visit.    Past Medical History  Diagnosis Date  . Coronary artery disease   . Hypertension   . Heart murmur   . Shortness of breath   . Dysrhythmia     atrial fib  . Arthritis   . Neuromuscular disorder     parkinsons  . CHF (congestive heart failure)   . Atrial fibrillation   . Gout   . Osteoporosis   . Hypomagnesemia     Hx.  . Mild memory disturbance   . Peptic ulcer disease   .  Degenerative arthritis   . Thyroid goiter     resection  . Parkinson disease     Past Surgical History  Procedure Laterality Date  . Pacemaker placement    . Insert / replace / remove pacemaker    . Abdominal hysterectomy    . Cholecystectomy    . Toe nails      removed  . Bladder tack    . Appendectomy    . Tonsillectomy    . Kyphoplasty      Compression fracture, lumbosacral spine  . Rectocele repair    . Cataract extraction, bilateral    . Hiatal hernia repair      Family History  Problem Relation Age of Onset  . Stroke Sister   . Parkinsonism Brother   . Diabetes Mother   . Heart attack Father     History   Social History  . Marital Status: Widowed     Spouse Name: N/A    Number of Children: 2  . Years of Education: 2-College   Occupational History  . Retired    Social History Main Topics  . Smoking status: Never Smoker   . Smokeless tobacco: Never Used  . Alcohol Use: No  . Drug Use: No  . Sexual Activity: No   Other Topics Concern  . Not on file   Social History Narrative  . No narrative on file    ROS:see history of present illness otherwise negative  BP 116/60 mmHg  Pulse 65  Ht 5\' 2"  (1.575 m)  Wt 112 lb (50.803 kg)  BMI 20.48 kg/m2  PHYSICAL EXAM: thin, elderly, in a wheelchair, in no acute distress. Neck: No JVD, HJR, Bruit, or thyroid enlargement  Lungs: No tachypnea, clear without wheezing, rales, or rhonchi  Cardiovascular: RRR, PMI not displaced, Normal S1 and S2, 2/6 systolic murmur at the left sternal border, no bruit, thrill, or heave.  Abdomen: BS normal. Soft without organomegaly, masses, lesions or tenderness.  Extremities: without cyanosis, clubbing or edema. Good distal pulses bilateral  SKin: Warm, no lesions or rashes   Musculoskeletal: No deformities  Neuro: no focal signs   Wt Readings from Last 3 Encounters:  05/19/14 117 lb 1 oz (53.1 kg)  02/15/14 113 lb (51.256 kg)  08/13/13 120 lb (54.432 kg)    Lab Results  Component Value Date   WBC 9.8 05/21/2014   HGB 10.9* 05/21/2014   HCT 32.8* 05/21/2014   PLT 120* 05/21/2014   GLUCOSE 105* 05/21/2014   CHOL 130 05/20/2014   TRIG 70 05/20/2014   HDL 46 05/20/2014   LDLCALC 70 05/20/2014   ALT 20 05/19/2014   AST 28 05/19/2014   NA 139 05/21/2014   K 4.6 05/21/2014   CL 101 05/21/2014   CREATININE 1.45* 05/21/2014   BUN 37* 05/21/2014   CO2 22 05/21/2014   TSH 1.110 05/19/2014   INR 1.25 05/19/2014   HGBA1C 6.0* 05/20/2014   2D ECHO:05/20/14 Study Conclusions  - Left ventricle: LVEF is approximately 35% with hypokinesis of the septum, distal lateral, distal posterior, distal anterior and apical walls; akinesis of  the mid/distal inferior wall. The cavity size was normal. Wall thickness was normal. - Mitral valve: There was moderate regurgitation. - Left atrium: The atrium was severely dilated. - Right atrium: The atrium was severely dilated. - Tricuspid valve: There was moderate regurgitation. - Pulmonary arteries: PA peak pressure: 35 mm Hg (  ZOX:WRUEAEKG:paced rhythm

## 2014-06-10 NOTE — Assessment & Plan Note (Signed)
Check renal function today. She is on lower dose Eliquis.

## 2014-06-10 NOTE — Assessment & Plan Note (Signed)
Patient's EF is 35% on 2-D echo. Currently compensated. Continue current medications.

## 2014-06-10 NOTE — Patient Instructions (Signed)
Your physician recommends that you continue on your current medications as directed. Please refer to the Current Medication list given to you today.   Your physician recommends that you return for lab work TODAY BMET   Your physician recommends that you schedule a follow-up appointment in:  WITH DR Katrinka BlazingSMITH AND DR Graciela HusbandsKLEIN IN January

## 2014-06-12 ENCOUNTER — Telehealth: Payer: Self-pay | Admitting: *Deleted

## 2014-06-12 NOTE — Telephone Encounter (Signed)
-----   Message from Dyann KiefMichele M Lenze, PA-C sent at 06/10/2014  2:11 PM EST ----- Kidney function stable

## 2014-06-19 ENCOUNTER — Encounter: Payer: Self-pay | Admitting: Neurology

## 2014-06-20 ENCOUNTER — Telehealth: Payer: Self-pay | Admitting: *Deleted

## 2014-06-20 NOTE — Telephone Encounter (Signed)
Called patient to r/s appointment time, no answer left message to return the call.

## 2014-06-21 NOTE — Telephone Encounter (Signed)
Spoke with patient's daughter Karoline Caldwellngie, appointment was scheduled for Wednesday 06/26/14 with NP LL..Marland Kitchen

## 2014-06-21 NOTE — Telephone Encounter (Signed)
Called patient no answer, called patient's daughter [name] and left a message to call back and r/s appointment.

## 2014-06-24 NOTE — Telephone Encounter (Signed)
Follow up pt returned call

## 2014-06-25 ENCOUNTER — Telehealth: Payer: Self-pay | Admitting: Internal Medicine

## 2014-06-25 ENCOUNTER — Encounter: Payer: Self-pay | Admitting: Neurology

## 2014-06-25 NOTE — Telephone Encounter (Signed)
New Msg   Please contact patients daughter for Lab results. [name] is the daughter calling, contact her at [phone].

## 2014-06-26 ENCOUNTER — Encounter: Payer: Self-pay | Admitting: Nurse Practitioner

## 2014-06-26 ENCOUNTER — Ambulatory Visit (INDEPENDENT_AMBULATORY_CARE_PROVIDER_SITE_OTHER): Payer: Medicare Other | Admitting: Nurse Practitioner

## 2014-06-26 VITALS — BP 142/81 | HR 64 | Ht 62.0 in | Wt 119.2 lb

## 2014-06-26 DIAGNOSIS — G20 Parkinson's disease: Secondary | ICD-10-CM

## 2014-06-26 DIAGNOSIS — I482 Chronic atrial fibrillation, unspecified: Secondary | ICD-10-CM

## 2014-06-26 DIAGNOSIS — G20A1 Parkinson's disease without dyskinesia, without mention of fluctuations: Secondary | ICD-10-CM

## 2014-06-26 DIAGNOSIS — I63412 Cerebral infarction due to embolism of left middle cerebral artery: Secondary | ICD-10-CM

## 2014-06-26 MED ORDER — CARBIDOPA-LEVODOPA 25-100 MG PO TABS: ORAL_TABLET | ORAL | Status: DC

## 2014-06-26 NOTE — Progress Notes (Signed)
PATIENT: [name] DOB: 05-Aug-1932  REASON FOR VISIT: hospital follow up for stroke HISTORY FROM: patient and daughter, records  HISTORY OF PRESENT ILLNESS: [name] is a 80 y.o. Female who comes to the office for first hospital follow up post hospital discharge for stroke. She is accompanied by her daughter today.  She was brought to the ED on 05/19/2014 with aphasia. She has a history of Parkinson's disease treated with carbidopa levodopa 25-100 3 times a day by Dr. Anne HahnWillis in our office. When daughter called her on the morning of 05/19/2014 she could not understand what she was saying on the phone. She denies any weakness or numbness, or difficulties with vision. Patient was not administered TPA secondary to delay in arrival. She was admitted for further evaluation and treatment. CT Head showed an acute left MCA infarct involving insula and temporoparietal junction, small vessel ischemic disease and brain atrophy. Left frontal lobe encephalomalacia compatible with previous cortical infarct. Carotid doppler was negative for significant stenosis. Transcranial doppler showed low normal mean flow velocities throughout anterior and posterior cerebral circulations with globally elevated pulsatility indices suggest diffuse intracranial atherosclerosis. 2D Echocardiogram showed an EF of 35% with with hypokinesis of the septum, distal lateral, distal posterior, distal anterior and apical walls; akinesis of the mid/distal inferior wall. Atrial fibrillation with no obvious source of embolus. She was known to have chronic atrial fibrillation, but off of Coumadin for the last year due to fall risk. She was taking daily aspirin. She was recommended to start back anticoagulation and was started on low dose Eliquis. She is tolerating Eliquis well with no signs of significant bleeding or bruising.  She has chronic diarrhea and this has been associated with weight loss in the past. She states her stools are  black and green. The patient goes on to sa that she takes 1200 mg of magnesium oxide twice daily for chronic hypomasnesiumia. At discharge she was sent to a rehab facility and then discharged to an assiited living facility, [facility] in [place]. She has remained on Sinemet 25/100 mg tablets 2-3 times daily. Her speech is improving.  She has intermittent difficulty getting her words out. She states that her hand tremors have worsened since the stroke. Of note, her Sinemet is scheduled for only twice daily at the assisted living.  She has occasional freezing episodes, but she has not had any falls. She uses a rolling walker for ambulation.   REVIEW OF SYSTEMS: Full 14 system review of systems performed and notable only for: activity change, fatigue, runny nose, drooling, blurred vision, leg swelling, black stools, diarrhea, incontinence of bowels and bladder, back pain, neck stiffness, memory loss, dizziness, headache, speech difficulty, weakness.  ALLERGIES: No Known Allergies  HOME MEDICATIONS: Outpatient Prescriptions Prior to Visit  Medication Sig Dispense Refill  . acetaminophen (TYLENOL) 325 MG tablet Take 162.5 mg by mouth every 6 (six) hours as needed for headache.     . allopurinol (ZYLOPRIM) 100 MG tablet Take 200 mg by mouth daily.     Marland Kitchen. aMILoride (MIDAMOR) 5 MG tablet Take 5 mg by mouth 3 (three) times a week. Monday, Wednesday and Friday.    Marland Kitchen. apixaban (ELIQUIS) 2.5 MG TABS tablet Take 1 tablet (2.5 mg total) by mouth 2 (two) times daily. 60 tablet 0  . Calcium Carb-Cholecalciferol (CALCIUM + D3) 600-200 MG-UNIT TABS Take 1 tablet by mouth daily.    . magnesium oxide (MAG-OX) 400 MG tablet Take 400 mg by mouth  2 (two) times daily. Taking 3 (400 mg) tablets twice daily    . metoprolol (LOPRESSOR) 25 MG tablet Take 1 tablet (25 mg total) by mouth 2 (two) times daily.    . pantoprazole (PROTONIX) 40 MG tablet Take 1 tablet (40 mg total) by mouth daily.    Marland Kitchen. PREVALITE 4 G packet Take  4 g by mouth daily.    Marland Kitchen. senna-docusate (SENOKOT-S) 8.6-50 MG per tablet Take 1 tablet by mouth at bedtime as needed for moderate constipation.    . simvastatin (ZOCOR) 20 MG tablet Take 1 tablet (20 mg total) by mouth at bedtime. 30 tablet 0  . carbidopa-levodopa (SINEMET IR) 25-100 MG per tablet Take 1 tablet by mouth 2 (two) times daily.    . IRON PO Take 65 mg by mouth daily.    . furosemide (LASIX) 20 MG tablet Take 1 tablet (20 mg total) by mouth daily. 30 tablet 1   No facility-administered medications prior to visit.    PHYSICAL EXAM Filed Vitals:   06/26/14 1110  BP: 142/81  Pulse: 64  Height: 5\' 2"  (1.575 m)  Weight: 119 lb 3.2 oz (54.069 kg)   Body mass index is 21.8 kg/(m^2).   MMSE - Mini Mental State Exam 06/26/2014  Orientation to time 5  Orientation to Place 4  Registration 3  Attention/ Calculation 2  Recall 2  Language- name 2 objects 2  Language- repeat 1  Language- follow 3 step command 3  Language- read & follow direction 1  Write a sentence 1  Copy design 0  Total score 24    Generalized: Well developed Elderly Caucasian female in no acute distress  Head: normocephalic and atraumatic. Oropharynx benign  Neck: Supple, no carotid bruits  Cardiac: Irregular rate rhythm, no murmur  Musculoskeletal: Mild kyphosis.  Neurologic Exam  Mental status: The Mini-Mental status examination done today shows a total score 24/30. Alert, short term memory is diminished. Language is intermittently non-fluent, some word finding difficulty. Follows commands well.  Mildly masked faces. Cranial nerves: Facial symmetry is present. Speech is normal, no aphasia or dysarthria is noted. Extraocular movements are full. Visual fields are full. Motor: The patient has good strength and tone in all 4 extremities. Bilateral tremors are noted, right greater than left. Sensory examination: Soft touch sensation is symmetric on the face, arms, and legs. Coordination: The patient has  good finger-nose-finger and heel-to-shin bilaterally. The patient has some apraxia with the use of the lower extremities. Gait and station: The patient is able to arise from a seated position with the arms crossed. Once up, she is able to ambulate with rolling walker, short shuffling steps.  The patient has decreased arm swing bilaterally. Tandem gait was not attempted. Romberg is negative. No drift is seen. Reflexes: Deep tendon reflexes are symmetric.  NIHSS: 1 MRs: 2  ASSESSMENT: [name] is a 80 y.o. Caucasian female with history of Parkinson's disease seen by Dr. Anne HahnWillis in our office, presenting with expressive aphasia on 05/19/14. She did not receive IV t-PA due to delay in arrival. CT confirmed left MCA infarct due to known atrial fibrillation. MRI/MRA not possible due to pacemaker. Residual mild expressive aphasia.   PLAN: I had a long discussion with the patient and daughter regarding her recent stroke, discussed results of evaluation in the hospital and answered questions.  Continue eliquis (apixaban) for secondary stroke prevention.  If patient starts falling often, it would be prudent to switch back to aspirin. Maintain strict  control of hypertension with blood pressure goal below 140/90, and lipids with LDL cholesterol goal below 100 mg/dL. Increase Sinemet 25-100 mg to 1.5 tablets three times daily, 30 minutes before meals. SE to watch for were discussed.  Followup in the future with Dr. Anne Hahn in 6 months, sooner as needed.  Meds ordered this encounter  Medications  . carbidopa-levodopa (SINEMET IR) 25-100 MG per tablet    Sig: Take 1.5 tablets three times daily, 30 minutes before each meal.    Dispense:  150 tablet    Refill:  11    Order Specific Question:  Supervising Provider    Answer:  York Spaniel [[id number]]   Tawny Asal Emmy Keng, MSN, FNP-BC, A/GNP-C 06/26/2014, 3:16 PM [facility] [address] [place], [place] [address] [phone]  Note: This document was prepared with digital dictation and possible smart phrase technology. Any transcriptional errors that result from this process are unintentional.

## 2014-06-26 NOTE — Patient Instructions (Addendum)
Continue eliquis (apixaban) for secondary stroke prevention and maintain strict control of hypertension with blood pressure goal below 140/90, and lipids with LDL cholesterol goal below 100 mg/dL.   Increase Sinemet to 1.5 tablets 3 times daily, take 30 minutes before meals for better absorption.  Watch for signs of increased hallucinations, dose is too much.  Followup in the future with Dr. Anne HahnWillis in 6 months, sooner as needed.

## 2014-06-26 NOTE — Progress Notes (Signed)
I have read the note, and I agree with the clinical assessment and plan.  Kadian Barcellos KEITH   

## 2014-06-26 NOTE — Telephone Encounter (Signed)
Informed patient's dtr lab results showed stable kidney function. dtr verbalized understanding.

## 2014-07-01 ENCOUNTER — Ambulatory Visit: Payer: Medicare Other | Admitting: Neurology

## 2014-07-09 ENCOUNTER — Ambulatory Visit: Payer: Medicare Other | Admitting: Neurology

## 2014-07-29 ENCOUNTER — Telehealth: Payer: Self-pay | Admitting: Neurology

## 2014-07-29 NOTE — Telephone Encounter (Signed)
Patient's daughter is calling because on patient's last visit her medication Parke SimmersCardopia Levoda was increased. Now patient is very confused. Can patient go back to original dosage. The patient is now in an assisted living facility. Please fax Rx to [facility] [phone]. Please call daughter and advise.Thank you.

## 2014-07-29 NOTE — Telephone Encounter (Signed)
Per previous portion of this message daughter is request that the patient go back to old dose of Carb/Levo, because increased dose caused confusion.  Lynn increase dose from 1 three times daily to 1.5 three times daily at last OV.  If dose change is permissible, they request a new Rx be faxed to Assisted Living.  Please advise.  Thank you.  I called back.  Got no answer at home or on cell.

## 2014-07-29 NOTE — Telephone Encounter (Signed)
I called the family, left a message, I will fax a prescription in for one tablet 3 times daily of Sinemet.

## 2014-07-30 ENCOUNTER — Other Ambulatory Visit: Payer: Self-pay | Admitting: Gastroenterology

## 2014-07-30 ENCOUNTER — Other Ambulatory Visit: Payer: Self-pay

## 2014-07-30 ENCOUNTER — Telehealth: Payer: Self-pay | Admitting: Neurology

## 2014-07-30 ENCOUNTER — Ambulatory Visit
Admission: RE | Admit: 2014-07-30 | Discharge: 2014-07-30 | Disposition: A | Discharge status: Home / Self Care | Payer: Medicare Other | Source: Ambulatory Visit | Attending: Gastroenterology | Admitting: Gastroenterology

## 2014-07-30 DIAGNOSIS — R197 Diarrhea, unspecified: Secondary | ICD-10-CM

## 2014-07-30 DIAGNOSIS — G20 Parkinson's disease: Secondary | ICD-10-CM

## 2014-07-30 MED ORDER — CARBIDOPA-LEVODOPA 25-100 MG PO TABS: 1.0000 | ORAL_TABLET | Freq: Three times a day (TID) | ORAL | Status: AC

## 2014-07-30 NOTE — Telephone Encounter (Signed)
Rx signed and faxed.

## 2014-07-30 NOTE — Telephone Encounter (Signed)
[name] with [facility] @ [phone], requesting Rx carbidopa-levodopa (SINEMET IR) 25-100 MG per tablet re faxed to [phone].  Never received original fax.  Please call and advise.

## 2014-07-30 NOTE — Telephone Encounter (Signed)
Pt's daughter is calling stating that [facility] did not get the fax for Sinement.  Please refax Rx to [phone].

## 2014-07-30 NOTE — Telephone Encounter (Signed)
Rx has been faxed.

## 2014-07-30 NOTE — Telephone Encounter (Signed)
Written Rx required for Assisted Living

## 2014-07-31 NOTE — Telephone Encounter (Signed)
Sp[facility] to [place] at [facility] and she has received the Rx for Sinemet.

## 2014-08-03 ENCOUNTER — Encounter (HOSPITAL_COMMUNITY): Payer: Self-pay | Admitting: Emergency Medicine

## 2014-08-03 ENCOUNTER — Emergency Department (HOSPITAL_COMMUNITY): Payer: Medicare Other

## 2014-08-03 ENCOUNTER — Inpatient Hospital Stay (HOSPITAL_COMMUNITY)
Admission: EM | Admit: 2014-08-03 | Discharge: 2014-08-06 | DRG: 535 | Disposition: A | Discharge status: Skilled Nursing Facility | Payer: Medicare Other | Attending: Internal Medicine | Admitting: Internal Medicine

## 2014-08-03 DIAGNOSIS — Z9049 Acquired absence of other specified parts of digestive tract: Secondary | ICD-10-CM | POA: Diagnosis present

## 2014-08-03 DIAGNOSIS — E785 Hyperlipidemia, unspecified: Secondary | ICD-10-CM | POA: Diagnosis present

## 2014-08-03 DIAGNOSIS — I2510 Atherosclerotic heart disease of native coronary artery without angina pectoris: Secondary | ICD-10-CM | POA: Diagnosis present

## 2014-08-03 DIAGNOSIS — F0280 Dementia in other diseases classified elsewhere without behavioral disturbance: Secondary | ICD-10-CM | POA: Diagnosis present

## 2014-08-03 DIAGNOSIS — I4891 Unspecified atrial fibrillation: Secondary | ICD-10-CM | POA: Diagnosis present

## 2014-08-03 DIAGNOSIS — Y92238 Other place in hospital as the place of occurrence of the external cause: Secondary | ICD-10-CM | POA: Diagnosis not present

## 2014-08-03 DIAGNOSIS — E43 Unspecified severe protein-calorie malnutrition: Secondary | ICD-10-CM | POA: Diagnosis present

## 2014-08-03 DIAGNOSIS — S32599A Other specified fracture of unspecified pubis, initial encounter for closed fracture: Secondary | ICD-10-CM | POA: Diagnosis present

## 2014-08-03 DIAGNOSIS — I482 Chronic atrial fibrillation: Secondary | ICD-10-CM

## 2014-08-03 DIAGNOSIS — Z9842 Cataract extraction status, left eye: Secondary | ICD-10-CM

## 2014-08-03 DIAGNOSIS — Z9841 Cataract extraction status, right eye: Secondary | ICD-10-CM | POA: Diagnosis not present

## 2014-08-03 DIAGNOSIS — Z7901 Long term (current) use of anticoagulants: Secondary | ICD-10-CM | POA: Diagnosis not present

## 2014-08-03 DIAGNOSIS — Z6822 Body mass index (BMI) 22.0-22.9, adult: Secondary | ICD-10-CM

## 2014-08-03 DIAGNOSIS — D638 Anemia in other chronic diseases classified elsewhere: Secondary | ICD-10-CM | POA: Diagnosis present

## 2014-08-03 DIAGNOSIS — K529 Noninfective gastroenteritis and colitis, unspecified: Secondary | ICD-10-CM | POA: Diagnosis present

## 2014-08-03 DIAGNOSIS — M25551 Pain in right hip: Secondary | ICD-10-CM | POA: Diagnosis not present

## 2014-08-03 DIAGNOSIS — R5381 Other malaise: Secondary | ICD-10-CM | POA: Diagnosis present

## 2014-08-03 DIAGNOSIS — Z66 Do not resuscitate: Secondary | ICD-10-CM | POA: Diagnosis present

## 2014-08-03 DIAGNOSIS — Z8711 Personal history of peptic ulcer disease: Secondary | ICD-10-CM | POA: Diagnosis not present

## 2014-08-03 DIAGNOSIS — M810 Age-related osteoporosis without current pathological fracture: Secondary | ICD-10-CM | POA: Diagnosis present

## 2014-08-03 DIAGNOSIS — S32591A Other specified fracture of right pubis, initial encounter for closed fracture: Secondary | ICD-10-CM | POA: Diagnosis present

## 2014-08-03 DIAGNOSIS — S32501A Unspecified fracture of right pubis, initial encounter for closed fracture: Secondary | ICD-10-CM

## 2014-08-03 DIAGNOSIS — M1990 Unspecified osteoarthritis, unspecified site: Secondary | ICD-10-CM | POA: Diagnosis present

## 2014-08-03 DIAGNOSIS — F0390 Unspecified dementia without behavioral disturbance: Secondary | ICD-10-CM | POA: Diagnosis present

## 2014-08-03 DIAGNOSIS — G20 Parkinson's disease: Secondary | ICD-10-CM | POA: Diagnosis present

## 2014-08-03 DIAGNOSIS — N183 Chronic kidney disease, stage 3 unspecified: Secondary | ICD-10-CM | POA: Diagnosis present

## 2014-08-03 DIAGNOSIS — W19XXXA Unspecified fall, initial encounter: Secondary | ICD-10-CM

## 2014-08-03 DIAGNOSIS — G3183 Dementia with Lewy bodies: Secondary | ICD-10-CM | POA: Diagnosis present

## 2014-08-03 DIAGNOSIS — W010XXA Fall on same level from slipping, tripping and stumbling without subsequent striking against object, initial encounter: Secondary | ICD-10-CM | POA: Diagnosis present

## 2014-08-03 DIAGNOSIS — I5022 Chronic systolic (congestive) heart failure: Secondary | ICD-10-CM | POA: Diagnosis present

## 2014-08-03 DIAGNOSIS — D689 Coagulation defect, unspecified: Secondary | ICD-10-CM | POA: Diagnosis present

## 2014-08-03 DIAGNOSIS — Z950 Presence of cardiac pacemaker: Secondary | ICD-10-CM | POA: Diagnosis not present

## 2014-08-03 DIAGNOSIS — M109 Gout, unspecified: Secondary | ICD-10-CM | POA: Diagnosis present

## 2014-08-03 DIAGNOSIS — Z8673 Personal history of transient ischemic attack (TIA), and cerebral infarction without residual deficits: Secondary | ICD-10-CM

## 2014-08-03 DIAGNOSIS — I129 Hypertensive chronic kidney disease with stage 1 through stage 4 chronic kidney disease, or unspecified chronic kidney disease: Secondary | ICD-10-CM | POA: Diagnosis present

## 2014-08-03 LAB — BASIC METABOLIC PANEL
Anion gap: 12 (ref 5–15)
BUN: 30 mg/dL — ABNORMAL HIGH (ref 6–23)
CALCIUM: 8.8 mg/dL (ref 8.4–10.5)
CO2: 22 mmol/L (ref 19–32)
Chloride: 105 mEq/L (ref 96–112)
Creatinine, Ser: 1.5 mg/dL — ABNORMAL HIGH (ref 0.50–1.10)
GFR calc Af Amer: 37 mL/min — ABNORMAL LOW (ref >90)
GFR, EST NON AFRICAN AMERICAN: 32 mL/min — AB (ref >90)
Glucose, Bld: 135 mg/dL — ABNORMAL HIGH (ref 70–99)
Potassium: 4.2 mmol/L (ref 3.5–5.1)
Sodium: 139 mmol/L (ref 135–145)

## 2014-08-03 LAB — CBC WITH DIFFERENTIAL/PLATELET
BASOS ABS: 0 10*3/uL (ref 0.0–0.1)
Basophils Relative: 0 % (ref 0–1)
EOS PCT: 1 % (ref 0–5)
Eosinophils Absolute: 0.1 10*3/uL (ref 0.0–0.7)
HCT: 35.4 % — ABNORMAL LOW (ref 36.0–46.0)
Hemoglobin: 11.3 g/dL — ABNORMAL LOW (ref 12.0–15.0)
Lymphocytes Relative: 9 % — ABNORMAL LOW (ref 12–46)
Lymphs Abs: 0.8 10*3/uL (ref 0.7–4.0)
MCH: 32 pg (ref 26.0–34.0)
MCHC: 31.9 g/dL (ref 30.0–36.0)
MCV: 100.3 fL — AB (ref 78.0–100.0)
Monocytes Absolute: 0.4 10*3/uL (ref 0.1–1.0)
Monocytes Relative: 4 % (ref 3–12)
Neutro Abs: 7.9 10*3/uL — ABNORMAL HIGH (ref 1.7–7.7)
Neutrophils Relative %: 86 % — ABNORMAL HIGH (ref 43–77)
Platelets: 154 10*3/uL (ref 150–400)
RBC: 3.53 MIL/uL — ABNORMAL LOW (ref 3.87–5.11)
RDW: 14.7 % (ref 11.5–15.5)
WBC: 9.2 10*3/uL (ref 4.0–10.5)

## 2014-08-03 MED ORDER — MORPHINE SULFATE 4 MG/ML IJ SOLN
4.0000 mg | Freq: Once | INTRAMUSCULAR | Status: AC
  Administered 2014-08-03: 4 mg via INTRAVENOUS
  Filled 2014-08-03: qty 1

## 2014-08-03 MED ORDER — FENTANYL CITRATE 0.05 MG/ML IJ SOLN
25.0000 ug | Freq: Once | INTRAMUSCULAR | Status: AC
  Administered 2014-08-03: 25 ug via INTRAVENOUS
  Filled 2014-08-03: qty 2

## 2014-08-03 NOTE — H&P (Addendum)
PCP:  Ailene Ravel, MD  [facility] Chief Complaint:  Fall hip pain  HPI: [name] is a 81 y.o. female   has a past medical history of Coronary artery disease; Hypertension; Heart murmur; Shortness of breath; Dysrhythmia; Arthritis; Neuromuscular disorder; CHF (congestive heart failure); Atrial fibrillation; Gout; Osteoporosis; Hypomagnesemia; Mild memory disturbance; Peptic ulcer disease; Degenerative arthritis; Thyroid goiter; and Parkinson disease.   Presented with  Patient resides at [facility] today she had a mechanical fall hitting her right hip. Patient was unable to bear weight. Was brought in to [facility] emergency department. Images of the right hip showed Acute fractures of the right superior and inferior pubic rami. Patient has history of Parkinson disease and dementia, Patient has history of Parkinson disease currently being treated by Sinemet 3 times a day. She have had a recent admission with CVA on 05/19/2014 CT Head showed an acute left MCA infarct,  Carotid doppler - negative, 2D Echo - EF of 35% with with hypokinesis and no obvious source of embolus. Sh has chronic A. fib  At that time She was off of Coumadin for the  year due to fall risk. She was taking daily aspirin. She was  started on low dose Eliquis.  Of note patient has history of chronic diarrhea going on for decades. She's been followed by GI Dr. Dulce Sellar. The plan was to start her on rifaximin family was about to get the prescription that she was admitted.  Hospitalist was called for admission for right superior and inferior pubic gram a fracture admitted for pain management  Review of Systems:    Pertinent positives include:   Constitutional:  No weight loss, night sweats, Fevers, chills, fatigue, weight loss  HEENT:  No headaches, Difficulty swallowing,Tooth/dental problems,Sore throat,  No sneezing, itching, ear ache, nasal congestion, post nasal drip,  Cardio-vascular:   No chest pain, Orthopnea, PND, anasarca, dizziness, palpitations.no Bilateral lower extremity swelling  GI:  No heartburn, indigestion, abdominal pain, nausea, vomiting, diarrhea, change in bowel habits, loss of appetite, melena, blood in stool, hematemesis Resp:  no shortness of breath at rest. No dyspnea on exertion, No excess mucus, no productive cough, No non-productive cough, No coughing up of blood.No change in color of mucus.No wheezing. Skin:  no rash or lesions. No jaundice GU:  no dysuria, change in color of urine, no urgency or frequency. No straining to urinate.  No flank pain.  Musculoskeletal:  No joint pain or no joint swelling. No decreased range of motion. No back pain.  Psych:  No change in mood or affect. No depression or anxiety. No memory loss.  Neuro: no localizing neurological complaints, no tingling, no weakness, no double vision, no gait abnormality, no slurred speech, no confusion  Otherwise ROS are negative except for above, 10 systems were reviewed  Past Medical History: Past Medical History  Diagnosis Date  . Coronary artery disease   . Hypertension   . Heart murmur   . Shortness of breath   . Dysrhythmia     atrial fib  . Arthritis   . Neuromuscular disorder     parkinsons  . CHF (congestive heart failure)   . Atrial fibrillation   . Gout   . Osteoporosis   . Hypomagnesemia     Hx.  . Mild memory disturbance   . Peptic ulcer disease   . Degenerative arthritis   . Thyroid goiter     resection  . Parkinson disease  Past Surgical History  Procedure Laterality Date  . Pacemaker placement    . Insert / replace / remove pacemaker    . Abdominal hysterectomy    . Cholecystectomy    . Toe nails      removed  . Bladder tack    . Appendectomy    . Tonsillectomy    . Kyphoplasty      Compression fracture, lumbosacral spine  . Rectocele repair    . Cataract extraction, bilateral    . Hiatal hernia repair       Medications: Prior to  Admission medications   Medication Sig Start Date End Date Taking? Authorizing Provider  acetaminophen (TYLENOL) 500 MG tablet Take 1,000 mg by mouth every 6 (six) hours as needed for moderate pain.   Yes Historical Provider, MD  allopurinol (ZYLOPRIM) 100 MG tablet Take 200 mg by mouth daily.    Yes Historical Provider, MD  alum & mag hydroxide-simeth (MAALOX/MYLANTA) 200-200-20 MG/5ML suspension Take 30 mLs by mouth every 6 (six) hours as needed for indigestion or heartburn.   Yes Historical Provider, MD  aMILoride (MIDAMOR) 5 MG tablet Take 5 mg by mouth 3 (three) times a week. Monday, Wednesday and Friday.   Yes Historical Provider, MD  apixaban (ELIQUIS) 2.5 MG TABS tablet Take 1 tablet (2.5 mg total) by mouth 2 (two) times daily. 05/22/14  Yes Ripudeep Jenna Luo, MD  bismuth subsalicylate (PEPTO BISMOL) 262 MG/15ML suspension Take 10 mLs by mouth every 8 (eight) hours as needed for indigestion.   Yes Historical Provider, MD  Calcium Carb-Cholecalciferol (CALCIUM + D3) 600-200 MG-UNIT TABS Take 1 tablet by mouth daily.   Yes Historical Provider, MD  carbidopa-levodopa (SINEMET IR) 25-100 MG per tablet Take 1 tablet by mouth 3 (three) times daily. 07/30/14  Yes York Spaniel, MD  furosemide (LASIX) 20 MG tablet Take 20 mg by mouth daily. 06/22/14  Yes Historical Provider, MD  guaiFENesin (ROBITUSSIN) 100 MG/5ML liquid Take 200 mg by mouth 3 (three) times daily as needed for cough.   Yes Historical Provider, MD  loperamide (IMODIUM) 2 MG capsule Take 2 mg by mouth daily as needed for diarrhea or loose stools.   Yes Historical Provider, MD  magnesium oxide (MAG-OX) 400 MG tablet Take 400 mg by mouth 2 (two) times daily. Taking 3 (400 mg) tablets twice daily   Yes Historical Provider, MD  metoprolol (LOPRESSOR) 25 MG tablet Take 1 tablet (25 mg total) by mouth 2 (two) times daily. 05/22/14  Yes Ripudeep Jenna Luo, MD  pantoprazole (PROTONIX) 40 MG tablet Take 1 tablet (40 mg total) by mouth daily.  05/22/14  Yes Ripudeep Jenna Luo, MD  PREVALITE 4 G packet Take 4 g by mouth daily. 04/03/14  Yes Historical Provider, MD  senna-docusate (SENOKOT-S) 8.6-50 MG per tablet Take 1 tablet by mouth at bedtime as needed for moderate constipation. 05/22/14  Yes Ripudeep Jenna Luo, MD  simvastatin (ZOCOR) 20 MG tablet Take 1 tablet (20 mg total) by mouth at bedtime. 05/22/14  Yes Ripudeep Jenna Luo, MD    Allergies:  No Known Allergies  Social History:  Ambulatory walker     From facility  [facility]   reports that she has never smoked. She has never used smokeless tobacco. She reports that she does not drink alcohol or use illicit drugs.    Family History: family history includes Diabetes in her mother; Heart attack in her father; Parkinsonism in her brother; Stroke in her  sister.    Physical Exam: Patient Vitals for the past 24 hrs:  BP Temp Temp src Pulse Resp SpO2  08/03/14 2031 148/84 mmHg 97.7 F (36.5 C) Oral 79 16 96 %  08/03/14 2018 - - - - - 94 %    1. General:  in No Acute distress 2. Psychological: Alert and   Oriented 3. Head/ENT:     Dry Mucous Membranes                          Head Non traumatic, neck supple                          Normal   Dentition 4. SKIN: normal Skin turgor,  Skin clean Dry and intact no rash 5. Heart: irregular rate and rhythm no Murmur, Rub or gallop 6. Lungs: Clear to auscultation bilaterally, no wheezes or crackles   7. Abdomen: Soft, non-tender, Non distended 8. Lower extremities: no clubbing, cyanosis, or edema 9. Neurologically Grossly intact, moving all 4 extremities equally 10. MSK: Normal range of motion  body mass index is unknown because there is no weight on file.   Labs on Admission:   Results for orders placed or performed during the hospital encounter of 08/03/14 (from the past 24 hour(s))  CBC with Differential     Status: Abnormal   Collection Time: 08/03/14  8:57 PM  Result Value Ref Range   WBC 9.2  4.0 - 10.5 K/uL   RBC 3.53 (L) 3.87 - 5.11 MIL/uL   Hemoglobin 11.3 (L) 12.0 - 15.0 g/dL   HCT 60.6 (L) 30.1 - 60.1 %   MCV 100.3 (H) 78.0 - 100.0 fL   MCH 32.0 26.0 - 34.0 pg   MCHC 31.9 30.0 - 36.0 g/dL   RDW 09.3 23.5 - 57.3 %   Platelets 154 150 - 400 K/uL   Neutrophils Relative % 86 (H) 43 - 77 %   Neutro Abs 7.9 (H) 1.7 - 7.7 K/uL   Lymphocytes Relative 9 (L) 12 - 46 %   Lymphs Abs 0.8 0.7 - 4.0 K/uL   Monocytes Relative 4 3 - 12 %   Monocytes Absolute 0.4 0.1 - 1.0 K/uL   Eosinophils Relative 1 0 - 5 %   Eosinophils Absolute 0.1 0.0 - 0.7 K/uL   Basophils Relative 0 0 - 1 %   Basophils Absolute 0.0 0.0 - 0.1 K/uL  Basic metabolic panel     Status: Abnormal   Collection Time: 08/03/14  8:57 PM  Result Value Ref Range   Sodium 139 135 - 145 mmol/L   Potassium 4.2 3.5 - 5.1 mmol/L   Chloride 105 96 - 112 mEq/L   CO2 22 19 - 32 mmol/L   Glucose, Bld 135 (H) 70 - 99 mg/dL   BUN 30 (H) 6 - 23 mg/dL   Creatinine, Ser 2.20 (H) 0.50 - 1.10 mg/dL   Calcium 8.8 8.4 - 25.4 mg/dL   GFR calc non Af Amer 32 (L) >90 mL/min   GFR calc Af Amer 37 (L) >90 mL/min   Anion gap 12 5 - 15    UA pending  Lab Results  Component Value Date   HGBA1C 6.0* 05/20/2014    CrCl cannot be calculated (Unknown ideal weight.).  BNP (last 3 results) No results for input(s): PROBNP in the last 8760 hours.  Other results:  I have pearsonaly reviewed this: ECG REPORT  Rate: 65  Rhythm: A.fib, paced ST&T Change: N/A   There were no vitals filed for this visit.   Cultures:    Component Value Date/Time   SDES URINE, CLEAN CATCH 08/31/2011 2237   SPECREQUEST NONE 08/31/2011 2237   CULT NO GROWTH 08/31/2011 2237   REPTSTATUS 09/01/2011 FINAL 08/31/2011 2237     Radiological Exams on Admission: Dg Hip Complete Left  08/03/2014   CLINICAL DATA:  Fall without loss of consciousness 06 p.m. this evening. Trip and fall injury, striking right hip on the floor.  EXAM: LEFT HIP - COMPLETE 2+  VIEW  COMPARISON:  None.  FINDINGS: Acute comminuted fractures of the right superior and inferior pubic rami. Diffuse bone demineralization. Degenerative changes in the lower lumbar spine and hips. Left hip appears intact without displaced fracture identified. Extensive vascular calcifications.  IMPRESSION: Acute comminuted fractures of the right superior and inferior pubic rami. No acute fractures of the left hip.   Electronically Signed   By: Burman Nieves M.D.   On: 08/03/2014 21:55   Dg Hip Complete Right  08/03/2014   CLINICAL DATA:  Initial encounter for tripped and fall walking to bathroom. Hip right hip on floor.  EXAM: RIGHT HIP - COMPLETE 2+ VIEW  COMPARISON:  12/01/2010.  FINDINGS: AP and frog-leg lateral views of the right hip show a comminuted fracture of the right superior pubic ramus. There is associated inferior pubic ramus on the right.  No evidence for femoral neck fracture.  Bones are diffusely demineralized.  IMPRESSION: Acute fractures of the right superior and inferior pubic rami.   Electronically Signed   By: Kennith Center M.D.   On: 08/03/2014 21:55   Ct Head Wo Contrast  08/03/2014   CLINICAL DATA:  Fall without loss of consciousness at 6 p.m. this evening. Trip and fall injury. Pain. Confusion and dementia is chronic.  EXAM: CT HEAD WITHOUT CONTRAST  CT CERVICAL SPINE WITHOUT CONTRAST  TECHNIQUE: Multidetector CT imaging of the head and cervical spine was performed following the standard protocol without intravenous contrast. Multiplanar CT image reconstructions of the cervical spine were also generated.  COMPARISON:  CT head 05/20/2014. CT head and cervical spine 12/01/2010.  FINDINGS: CT HEAD FINDINGS  Diffuse cerebral atrophy. Mild ventricular dilatation consistent with central atrophy. Low-attenuation changes throughout the deep white matter consistent with small vessel ischemia. Patchy areas of encephalomalacia in the left frontal lobe consistent with old infarct and unchanged  since prior study. No mass effect or midline shift. No abnormal extra-axial fluid collections. Gray-white matter junctions are distinct. Basal cisterns are not effaced. No evidence of acute intracranial hemorrhage. No depressed skull fractures. Visualized paranasal sinuses and mastoid air cells are not opacified. Vascular calcifications.  CT CERVICAL SPINE FINDINGS  Straightening of the usual cervical lordosis. This may be due to patient positioning but ligamentous injury or muscle spasm could also have this appearance and are not excluded. Slight anterior subluxation of C7 on T1 is unchanged since prior study. Normal alignment of the facet joints. Degenerative changes throughout the cervical spine with narrowed cervical interspaces and associated endplate hypertrophic changes. Degenerative changes in the cervical facet joints. C1-2 articulation appears intact. There is some compression of the lateral masses of C1 resulting in basilar invagination of the odontoid process in the foramen magnum. Erosion at the base of the dens posteriorly. Changes are consistent with rheumatoid arthritis. These changes have progressed since previous study under not likely to represent acute posttraumatic change. Otherwise, no vertebral compression deformities  are demonstrated. Diffuse bone demineralization. No prevertebral soft tissue swelling. Vascular calcifications in the cervical carotid vessels. Large right thyroid mass extending into the thoracic inlet, similar to prior study.  IMPRESSION: No acute intracranial abnormalities. Chronic atrophy and small vessel ischemic changes.  Nonspecific straightening of the usual cervical lordosis. Diffuse bone demineralization and degenerative changes throughout the cervical spine. Interval development/ progression of erosive changes at the base of the odontoid process of C2 and compression of the lateral masses of C1 resulting in basilar invagination. No acute displaced fractures are  demonstrated.   Electronically Signed   By: Burman Nieves M.D.   On: 08/03/2014 21:37   Ct Cervical Spine Wo Contrast  08/03/2014   CLINICAL DATA:  Fall without loss of consciousness at 6 p.m. this evening. Trip and fall injury. Pain. Confusion and dementia is chronic.  EXAM: CT HEAD WITHOUT CONTRAST  CT CERVICAL SPINE WITHOUT CONTRAST  TECHNIQUE: Multidetector CT imaging of the head and cervical spine was performed following the standard protocol without intravenous contrast. Multiplanar CT image reconstructions of the cervical spine were also generated.  COMPARISON:  CT head 05/20/2014. CT head and cervical spine 12/01/2010.  FINDINGS: CT HEAD FINDINGS  Diffuse cerebral atrophy. Mild ventricular dilatation consistent with central atrophy. Low-attenuation changes throughout the deep white matter consistent with small vessel ischemia. Patchy areas of encephalomalacia in the left frontal lobe consistent with old infarct and unchanged since prior study. No mass effect or midline shift. No abnormal extra-axial fluid collections. Gray-white matter junctions are distinct. Basal cisterns are not effaced. No evidence of acute intracranial hemorrhage. No depressed skull fractures. Visualized paranasal sinuses and mastoid air cells are not opacified. Vascular calcifications.  CT CERVICAL SPINE FINDINGS  Straightening of the usual cervical lordosis. This may be due to patient positioning but ligamentous injury or muscle spasm could also have this appearance and are not excluded. Slight anterior subluxation of C7 on T1 is unchanged since prior study. Normal alignment of the facet joints. Degenerative changes throughout the cervical spine with narrowed cervical interspaces and associated endplate hypertrophic changes. Degenerative changes in the cervical facet joints. C1-2 articulation appears intact. There is some compression of the lateral masses of C1 resulting in basilar invagination of the odontoid process in the  foramen magnum. Erosion at the base of the dens posteriorly. Changes are consistent with rheumatoid arthritis. These changes have progressed since previous study under not likely to represent acute posttraumatic change. Otherwise, no vertebral compression deformities are demonstrated. Diffuse bone demineralization. No prevertebral soft tissue swelling. Vascular calcifications in the cervical carotid vessels. Large right thyroid mass extending into the thoracic inlet, similar to prior study.  IMPRESSION: No acute intracranial abnormalities. Chronic atrophy and small vessel ischemic changes.  Nonspecific straightening of the usual cervical lordosis. Diffuse bone demineralization and degenerative changes throughout the cervical spine. Interval development/ progression of erosive changes at the base of the odontoid process of C2 and compression of the lateral masses of C1 resulting in basilar invagination. No acute displaced fractures are demonstrated.   Electronically Signed   By: Burman Nieves M.D.   On: 08/03/2014 21:37    Chart has been reviewed  Assessment/Plan  81 year old female with history of atrial fibrillation and chronic anticoagulation. Recent history of stroke presents with mechanical fall resulting in multiple closed rami fractures here for pain control  Present on Admission:  . Fracture of multiple pubic rami- -pain management and conservative treatment patient will likely require placement  . A-fib - currently rate controlled  continue home medications continue apixaban and metoprolol  . Chronic systolic heart failure - continue Lasix  . CKD (chronic kidney disease) stage 3, GFR 30-59 ml/min - stable continue monitoring  . Coronary artery disease - currently symptomatic will continue home medications including statin and beta blocker  . Parkinson disease- continue Sinemet  . Dementia- watch for sundowning  . Debility - frequent falls and OT evaluation social work consult for placement  to nursing facility   chronic diarrhea- will start Rifaximin, GI consult if no improvement to titrate up Prophylaxis: apixaban, Protonix  CODE STATUS:   DNR/DNI as per Family  Other plan as per orders.  I have spent a total of 55 min on this admission  [name] 08/03/2014, 10:41 PM  [facility]  Pager [phone]   after 2 AM please page floor coverage PA If 7AM-7PM, please contact the day team taking care of the patient  [email or web address]  Password [email or web address]

## 2014-08-03 NOTE — ED Notes (Signed)
Bed: WA09 Expected date:  Expected time:  Means of arrival:  Comments: EMS 

## 2014-08-03 NOTE — ED Notes (Signed)
Per EMS, pt. From [facility], reported of fall without LOC at 6pm this evening, reported that pt. Was walking to her bathroom , tripped and fell , hit her right hip on the floor, pain at 7/10. Pt. She has dementia , oriented x 1. Pt. Is not on blood thinner.

## 2014-08-03 NOTE — ED Notes (Signed)
Patient transported to X-ray 

## 2014-08-03 NOTE — ED Provider Notes (Signed)
CSN: [id number]     Arrival date & time 08/03/14  2017 History   First MD Initiated Contact with Patient 08/03/14 2031     Chief Complaint  Patient presents with  . Fall  . Hip Pain    right     (Consider location/radiation/quality/duration/timing/severity/associated sxs/prior Treatment) HPI Comments: Patient with history of Parkinson's disease, CAD, CHF, hypertension, and dementia presents emergency department with chief complaint of fall. Patient states that she tripped and fell in the bathroom. She is uncertain as to whether she hit her head or not. She complains of pain in her right hip and left shoulder. She states the pain is 7 out of 10. History is somewhat difficult to elicit secondary to dementia. Contrary to nursing note, patient is taking a blood thinner, Eliquis. Reportedly, when the patient was lifted onto the stretcher she screamed in pain coming from the right hip. She denies any chest pain or shortness breath or belly pain.  The history is provided by the patient. No language interpreter was used.    Past Medical History  Diagnosis Date  . Coronary artery disease   . Hypertension   . Heart murmur   . Shortness of breath   . Dysrhythmia     atrial fib  . Arthritis   . Neuromuscular disorder     parkinsons  . CHF (congestive heart failure)   . Atrial fibrillation   . Gout   . Osteoporosis   . Hypomagnesemia     Hx.  . Mild memory disturbance   . Peptic ulcer disease   . Degenerative arthritis   . Thyroid goiter     resection  . Parkinson disease    Past Surgical History  Procedure Laterality Date  . Pacemaker placement    . Insert / replace / remove pacemaker    . Abdominal hysterectomy    . Cholecystectomy    . Toe nails      removed  . Bladder tack    . Appendectomy    . Tonsillectomy    . Kyphoplasty      Compression fracture, lumbosacral spine  . Rectocele repair    . Cataract extraction, bilateral    . Hiatal hernia repair     Family History   Problem Relation Age of Onset  . Stroke Sister   . Parkinsonism Brother   . Diabetes Mother   . Heart attack Father    History  Substance Use Topics  . Smoking status: Never Smoker   . Smokeless tobacco: Never Used  . Alcohol Use: No   OB History    No data available     Review of Systems  Constitutional: Negative for fever and chills.  Respiratory: Negative for shortness of breath.   Cardiovascular: Negative for chest pain.  Gastrointestinal: Negative for nausea, vomiting, diarrhea and constipation.  Genitourinary: Negative for dysuria.  Musculoskeletal: Positive for arthralgias.  All other systems reviewed and are negative.     Allergies  Review of patient's allergies indicates no known allergies.  Home Medications   Prior to Admission medications   Medication Sig Start Date End Date Taking? Authorizing Provider  acetaminophen (TYLENOL) 500 MG tablet Take 1,000 mg by mouth every 6 (six) hours as needed for moderate pain.   Yes Historical Provider, MD  allopurinol (ZYLOPRIM) 100 MG tablet Take 200 mg by mouth daily.    Yes Historical Provider, MD  alum & mag hydroxide-simeth (MAALOX/MYLANTA) 200-200-20 MG/5ML suspension Take 30 mLs by mouth every 6 (  six) hours as needed for indigestion or heartburn.   Yes Historical Provider, MD  aMILoride (MIDAMOR) 5 MG tablet Take 5 mg by mouth 3 (three) times a week. Monday, Wednesday and Friday.   Yes Historical Provider, MD  apixaban (ELIQUIS) 2.5 MG TABS tablet Take 1 tablet (2.5 mg total) by mouth 2 (two) times daily. 05/22/14  Yes Ripudeep Jenna Luo, MD  bismuth subsalicylate (PEPTO BISMOL) 262 MG/15ML suspension Take 10 mLs by mouth every 8 (eight) hours as needed for indigestion.   Yes Historical Provider, MD  Calcium Carb-Cholecalciferol (CALCIUM + D3) 600-200 MG-UNIT TABS Take 1 tablet by mouth daily.   Yes Historical Provider, MD  carbidopa-levodopa (SINEMET IR) 25-100 MG per tablet Take 1 tablet by mouth 3 (three) times daily.  07/30/14  Yes York Spaniel, MD  furosemide (LASIX) 20 MG tablet Take 20 mg by mouth daily. 06/22/14  Yes Historical Provider, MD  guaiFENesin (ROBITUSSIN) 100 MG/5ML liquid Take 200 mg by mouth 3 (three) times daily as needed for cough.   Yes Historical Provider, MD  loperamide (IMODIUM) 2 MG capsule Take 2 mg by mouth daily as needed for diarrhea or loose stools.   Yes Historical Provider, MD  magnesium oxide (MAG-OX) 400 MG tablet Take 400 mg by mouth 2 (two) times daily. Taking 3 (400 mg) tablets twice daily   Yes Historical Provider, MD  metoprolol (LOPRESSOR) 25 MG tablet Take 1 tablet (25 mg total) by mouth 2 (two) times daily. 05/22/14  Yes Ripudeep Jenna Luo, MD  pantoprazole (PROTONIX) 40 MG tablet Take 1 tablet (40 mg total) by mouth daily. 05/22/14  Yes Ripudeep Jenna Luo, MD  PREVALITE 4 G packet Take 4 g by mouth daily. 04/03/14  Yes Historical Provider, MD  senna-docusate (SENOKOT-S) 8.6-50 MG per tablet Take 1 tablet by mouth at bedtime as needed for moderate constipation. 05/22/14  Yes Ripudeep Jenna Luo, MD  simvastatin (ZOCOR) 20 MG tablet Take 1 tablet (20 mg total) by mouth at bedtime. 05/22/14  Yes Ripudeep K Rai, MD   BP 148/84 mmHg  Pulse 79  Temp(Src) 97.7 F (36.5 C) (Oral)  Resp 16  SpO2 96% Physical Exam  Constitutional: She is oriented to person, place, and time. She appears well-developed and well-nourished.  HENT:  Head: Normocephalic and atraumatic.  Eyes: Conjunctivae and EOM are normal. Pupils are equal, round, and reactive to light.  Neck: Normal range of motion. Neck supple.  Cardiovascular: Normal rate and regular rhythm.  Exam reveals no gallop and no friction rub.   No murmur heard. Pulmonary/Chest: Effort normal and breath sounds normal. No respiratory distress. She has no wheezes. She has no rales. She exhibits no tenderness.  Abdominal: Soft. Bowel sounds are normal. She exhibits no distension and no mass. There is no tenderness. There is no rebound and no  guarding.  Musculoskeletal: Normal range of motion. She exhibits no edema or tenderness.  Unwilling to move right hip through range of motion secondary to pain  Other extremities ROM and strength 5/5  Neurological: She is alert and oriented to person, place, and time.  Skin: Skin is warm and dry.  Psychiatric: She has a normal mood and affect. Her behavior is normal. Judgment and thought content normal.  Nursing note and vitals reviewed.   ED Course  Procedures (including critical care time) Results for orders placed or performed during the hospital encounter of 08/03/14  CBC with Differential  Result Value Ref Range   WBC 9.2 4.0 - 10.5 K/uL  RBC 3.53 (L) 3.87 - 5.11 MIL/uL   Hemoglobin 11.3 (L) 12.0 - 15.0 g/dL   HCT 16.1 (L) 09.6 - 04.5 %   MCV 100.3 (H) 78.0 - 100.0 fL   MCH 32.0 26.0 - 34.0 pg   MCHC 31.9 30.0 - 36.0 g/dL   RDW 40.9 81.1 - 91.4 %   Platelets 154 150 - 400 K/uL   Neutrophils Relative % 86 (H) 43 - 77 %   Neutro Abs 7.9 (H) 1.7 - 7.7 K/uL   Lymphocytes Relative 9 (L) 12 - 46 %   Lymphs Abs 0.8 0.7 - 4.0 K/uL   Monocytes Relative 4 3 - 12 %   Monocytes Absolute 0.4 0.1 - 1.0 K/uL   Eosinophils Relative 1 0 - 5 %   Eosinophils Absolute 0.1 0.0 - 0.7 K/uL   Basophils Relative 0 0 - 1 %   Basophils Absolute 0.0 0.0 - 0.1 K/uL  Basic metabolic panel  Result Value Ref Range   Sodium 139 135 - 145 mmol/L   Potassium 4.2 3.5 - 5.1 mmol/L   Chloride 105 96 - 112 mEq/L   CO2 22 19 - 32 mmol/L   Glucose, Bld 135 (H) 70 - 99 mg/dL   BUN 30 (H) 6 - 23 mg/dL   Creatinine, Ser 7.82 (H) 0.50 - 1.10 mg/dL   Calcium 8.8 8.4 - 95.6 mg/dL   GFR calc non Af Amer 32 (L) >90 mL/min   GFR calc Af Amer 37 (L) >90 mL/min   Anion gap 12 5 - 15  Urinalysis, Routine w reflex microscopic  Result Value Ref Range   Color, Urine YELLOW YELLOW   APPearance CLEAR CLEAR   Specific Gravity, Urine 1.019 1.005 - 1.030   pH 6.0 5.0 - 8.0   Glucose, UA NEGATIVE NEGATIVE mg/dL   Hgb  urine dipstick NEGATIVE NEGATIVE   Bilirubin Urine NEGATIVE NEGATIVE   Ketones, ur NEGATIVE NEGATIVE mg/dL   Protein, ur 30 (A) NEGATIVE mg/dL   Urobilinogen, UA 0.2 0.0 - 1.0 mg/dL   Nitrite NEGATIVE NEGATIVE   Leukocytes, UA TRACE (A) NEGATIVE  Urine microscopic-add on  Result Value Ref Range   Squamous Epithelial / LPF RARE RARE   WBC, UA 7-10 <3 WBC/hpf   RBC / HPF 0-2 <3 RBC/hpf   Bacteria, UA MANY (A) RARE   Dg Hip Complete Left  08/03/2014   CLINICAL DATA:  Fall without loss of consciousness 06 p.m. this evening. Trip and fall injury, striking right hip on the floor.  EXAM: LEFT HIP - COMPLETE 2+ VIEW  COMPARISON:  None.  FINDINGS: Acute comminuted fractures of the right superior and inferior pubic rami. Diffuse bone demineralization. Degenerative changes in the lower lumbar spine and hips. Left hip appears intact without displaced fracture identified. Extensive vascular calcifications.  IMPRESSION: Acute comminuted fractures of the right superior and inferior pubic rami. No acute fractures of the left hip.   Electronically Signed   By: Burman Nieves M.D.   On: 08/03/2014 21:55   Dg Hip Complete Right  08/03/2014   CLINICAL DATA:  Initial encounter for tripped and fall walking to bathroom. Hip right hip on floor.  EXAM: RIGHT HIP - COMPLETE 2+ VIEW  COMPARISON:  12/01/2010.  FINDINGS: AP and frog-leg lateral views of the right hip show a comminuted fracture of the right superior pubic ramus. There is associated inferior pubic ramus on the right.  No evidence for femoral neck fracture.  Bones are diffusely demineralized.  IMPRESSION:  Acute fractures of the right superior and inferior pubic rami.   Electronically Signed   By: Kennith Center M.D.   On: 08/03/2014 21:55   Ct Head Wo Contrast  08/03/2014   CLINICAL DATA:  Fall without loss of consciousness at 6 p.m. this evening. Trip and fall injury. Pain. Confusion and dementia is chronic.  EXAM: CT HEAD WITHOUT CONTRAST  CT CERVICAL SPINE  WITHOUT CONTRAST  TECHNIQUE: Multidetector CT imaging of the head and cervical spine was performed following the standard protocol without intravenous contrast. Multiplanar CT image reconstructions of the cervical spine were also generated.  COMPARISON:  CT head 05/20/2014. CT head and cervical spine 12/01/2010.  FINDINGS: CT HEAD FINDINGS  Diffuse cerebral atrophy. Mild ventricular dilatation consistent with central atrophy. Low-attenuation changes throughout the deep white matter consistent with small vessel ischemia. Patchy areas of encephalomalacia in the left frontal lobe consistent with old infarct and unchanged since prior study. No mass effect or midline shift. No abnormal extra-axial fluid collections. Gray-white matter junctions are distinct. Basal cisterns are not effaced. No evidence of acute intracranial hemorrhage. No depressed skull fractures. Visualized paranasal sinuses and mastoid air cells are not opacified. Vascular calcifications.  CT CERVICAL SPINE FINDINGS  Straightening of the usual cervical lordosis. This may be due to patient positioning but ligamentous injury or muscle spasm could also have this appearance and are not excluded. Slight anterior subluxation of C7 on T1 is unchanged since prior study. Normal alignment of the facet joints. Degenerative changes throughout the cervical spine with narrowed cervical interspaces and associated endplate hypertrophic changes. Degenerative changes in the cervical facet joints. C1-2 articulation appears intact. There is some compression of the lateral masses of C1 resulting in basilar invagination of the odontoid process in the foramen magnum. Erosion at the base of the dens posteriorly. Changes are consistent with rheumatoid arthritis. These changes have progressed since previous study under not likely to represent acute posttraumatic change. Otherwise, no vertebral compression deformities are demonstrated. Diffuse bone demineralization. No  prevertebral soft tissue swelling. Vascular calcifications in the cervical carotid vessels. Large right thyroid mass extending into the thoracic inlet, similar to prior study.  IMPRESSION: No acute intracranial abnormalities. Chronic atrophy and small vessel ischemic changes.  Nonspecific straightening of the usual cervical lordosis. Diffuse bone demineralization and degenerative changes throughout the cervical spine. Interval development/ progression of erosive changes at the base of the odontoid process of C2 and compression of the lateral masses of C1 resulting in basilar invagination. No acute displaced fractures are demonstrated.   Electronically Signed   By: Burman Nieves M.D.   On: 08/03/2014 21:37   Ct Cervical Spine Wo Contrast  08/03/2014   CLINICAL DATA:  Fall without loss of consciousness at 6 p.m. this evening. Trip and fall injury. Pain. Confusion and dementia is chronic.  EXAM: CT HEAD WITHOUT CONTRAST  CT CERVICAL SPINE WITHOUT CONTRAST  TECHNIQUE: Multidetector CT imaging of the head and cervical spine was performed following the standard protocol without intravenous contrast. Multiplanar CT image reconstructions of the cervical spine were also generated.  COMPARISON:  CT head 05/20/2014. CT head and cervical spine 12/01/2010.  FINDINGS: CT HEAD FINDINGS  Diffuse cerebral atrophy. Mild ventricular dilatation consistent with central atrophy. Low-attenuation changes throughout the deep white matter consistent with small vessel ischemia. Patchy areas of encephalomalacia in the left frontal lobe consistent with old infarct and unchanged since prior study. No mass effect or midline shift. No abnormal extra-axial fluid collections. Gray-white matter junctions are distinct.  Basal cisterns are not effaced. No evidence of acute intracranial hemorrhage. No depressed skull fractures. Visualized paranasal sinuses and mastoid air cells are not opacified. Vascular calcifications.  CT CERVICAL SPINE FINDINGS   Straightening of the usual cervical lordosis. This may be due to patient positioning but ligamentous injury or muscle spasm could also have this appearance and are not excluded. Slight anterior subluxation of C7 on T1 is unchanged since prior study. Normal alignment of the facet joints. Degenerative changes throughout the cervical spine with narrowed cervical interspaces and associated endplate hypertrophic changes. Degenerative changes in the cervical facet joints. C1-2 articulation appears intact. There is some compression of the lateral masses of C1 resulting in basilar invagination of the odontoid process in the foramen magnum. Erosion at the base of the dens posteriorly. Changes are consistent with rheumatoid arthritis. These changes have progressed since previous study under not likely to represent acute posttraumatic change. Otherwise, no vertebral compression deformities are demonstrated. Diffuse bone demineralization. No prevertebral soft tissue swelling. Vascular calcifications in the cervical carotid vessels. Large right thyroid mass extending into the thoracic inlet, similar to prior study.  IMPRESSION: No acute intracranial abnormalities. Chronic atrophy and small vessel ischemic changes.  Nonspecific straightening of the usual cervical lordosis. Diffuse bone demineralization and degenerative changes throughout the cervical spine. Interval development/ progression of erosive changes at the base of the odontoid process of C2 and compression of the lateral masses of C1 resulting in basilar invagination. No acute displaced fractures are demonstrated.   Electronically Signed   By: Burman Nieves M.D.   On: 08/03/2014 21:37   Dg Abd 2 Views  07/30/2014   CLINICAL DATA:  Chronic diarrhea.  Generalized abdominal pain.  EXAM: ABDOMEN - 2 VIEW  COMPARISON:  CT 07/19/2011.  FINDINGS: Bowel gas pattern does not show evidence of ileus, obstruction or free air. There does not appear to be any significant  amount of stool. Calcifications over the right upper quadrant relates to costal cartilage. There is extensive vascular calcification throughout the region. Clips are present in the right upper quadrant related to previous cholecystectomy previous vertebral augmentation at L1 and L2.  IMPRESSION: Gas pattern is unremarkable.  Normal, small amount of stool.   Electronically Signed   By: Paulina Fusi M.D.   On: 07/30/2014 15:38      EKG Interpretation None      MDM   Final diagnoses:  Fall  Pubic ramus fracture, right, closed, initial encounter    Patient with mechanical fall and superior and inferior pubic ramus fractures.  Patient is unable to ambulate. Patient will need to be admitted to the hospital for pain control. Discussed plan with family members who agree with the plan.  Patient seen by and discussed with Dr. Patria Mane.  Appreciate Dr. Adela Glimpse for admission.    Roxy Horseman, PA-C 08/04/14 0033  Lyanne Co, MD 08/05/14 [phone]

## 2014-08-04 DIAGNOSIS — S32501S Unspecified fracture of right pubis, sequela: Secondary | ICD-10-CM

## 2014-08-04 LAB — URINALYSIS, ROUTINE W REFLEX MICROSCOPIC
Bilirubin Urine: NEGATIVE
Glucose, UA: NEGATIVE mg/dL
Hgb urine dipstick: NEGATIVE
Ketones, ur: NEGATIVE mg/dL
Nitrite: NEGATIVE
Protein, ur: 30 mg/dL — AB
Specific Gravity, Urine: 1.019 (ref 1.005–1.030)
Urobilinogen, UA: 0.2 mg/dL (ref 0.0–1.0)
pH: 6 (ref 5.0–8.0)

## 2014-08-04 LAB — CBC
HCT: 34.5 % — ABNORMAL LOW (ref 36.0–46.0)
Hemoglobin: 10.9 g/dL — ABNORMAL LOW (ref 12.0–15.0)
MCH: 31.9 pg (ref 26.0–34.0)
MCHC: 31.6 g/dL (ref 30.0–36.0)
MCV: 100.9 fL — ABNORMAL HIGH (ref 78.0–100.0)
Platelets: 124 10*3/uL — ABNORMAL LOW (ref 150–400)
RBC: 3.42 MIL/uL — ABNORMAL LOW (ref 3.87–5.11)
RDW: 14.7 % (ref 11.5–15.5)
WBC: 8.8 10*3/uL (ref 4.0–10.5)

## 2014-08-04 LAB — COMPREHENSIVE METABOLIC PANEL
ALT: 8 U/L (ref 0–35)
AST: 20 U/L (ref 0–37)
Albumin: 4.2 g/dL (ref 3.5–5.2)
Alkaline Phosphatase: 137 U/L — ABNORMAL HIGH (ref 39–117)
Anion gap: 8 (ref 5–15)
BUN: 32 mg/dL — ABNORMAL HIGH (ref 6–23)
CHLORIDE: 104 meq/L (ref 96–112)
CO2: 23 mmol/L (ref 19–32)
Calcium: 8.8 mg/dL (ref 8.4–10.5)
Creatinine, Ser: 1.43 mg/dL — ABNORMAL HIGH (ref 0.50–1.10)
GFR calc Af Amer: 39 mL/min — ABNORMAL LOW (ref >90)
GFR calc non Af Amer: 34 mL/min — ABNORMAL LOW (ref >90)
GLUCOSE: 117 mg/dL — AB (ref 70–99)
POTASSIUM: 4.4 mmol/L (ref 3.5–5.1)
Sodium: 135 mmol/L (ref 135–145)
TOTAL PROTEIN: 6.5 g/dL (ref 6.0–8.3)
Total Bilirubin: 1.2 mg/dL (ref 0.3–1.2)

## 2014-08-04 LAB — TSH: TSH: 1.359 u[IU]/mL (ref 0.350–4.500)

## 2014-08-04 LAB — URINE MICROSCOPIC-ADD ON

## 2014-08-04 LAB — PHOSPHORUS: Phosphorus: 4.5 mg/dL (ref 2.3–4.6)

## 2014-08-04 LAB — MAGNESIUM: MAGNESIUM: 1.9 mg/dL (ref 1.5–2.5)

## 2014-08-04 MED ORDER — ONDANSETRON HCL 4 MG PO TABS: 4.0000 mg | ORAL_TABLET | Freq: Four times a day (QID) | ORAL | Status: DC | PRN

## 2014-08-04 MED ORDER — MORPHINE SULFATE 2 MG/ML IJ SOLN: 2.0000 mg | INTRAMUSCULAR | Status: DC | PRN

## 2014-08-04 MED ORDER — PANTOPRAZOLE SODIUM 40 MG PO TBEC
40.0000 mg | DELAYED_RELEASE_TABLET | Freq: Every day | ORAL | Status: DC
  Administered 2014-08-04 – 2014-08-06 (×3): 40 mg via ORAL
  Filled 2014-08-04 (×3): qty 1

## 2014-08-04 MED ORDER — ONDANSETRON HCL 4 MG/2ML IJ SOLN: 4.0000 mg | Freq: Four times a day (QID) | INTRAMUSCULAR | Status: DC | PRN

## 2014-08-04 MED ORDER — METOPROLOL TARTRATE 25 MG PO TABS
25.0000 mg | ORAL_TABLET | Freq: Two times a day (BID) | ORAL | Status: DC
  Administered 2014-08-04 – 2014-08-05 (×3): 25 mg via ORAL
  Filled 2014-08-04 (×5): qty 1

## 2014-08-04 MED ORDER — SODIUM CHLORIDE 0.9 % IJ SOLN
3.0000 mL | Freq: Two times a day (BID) | INTRAMUSCULAR | Status: DC
  Administered 2014-08-04 – 2014-08-06 (×4): 3 mL via INTRAVENOUS

## 2014-08-04 MED ORDER — MAGNESIUM OXIDE 400 (241.3 MG) MG PO TABS
400.0000 mg | ORAL_TABLET | Freq: Two times a day (BID) | ORAL | Status: DC
  Administered 2014-08-04 – 2014-08-06 (×5): 400 mg via ORAL
  Filled 2014-08-04 (×7): qty 1

## 2014-08-04 MED ORDER — SODIUM CHLORIDE 0.9 % IV SOLN: 250.0000 mL | INTRAVENOUS | Status: DC | PRN

## 2014-08-04 MED ORDER — ACETAMINOPHEN 325 MG PO TABS: 650.0000 mg | ORAL_TABLET | Freq: Four times a day (QID) | ORAL | Status: DC | PRN

## 2014-08-04 MED ORDER — CHOLESTYRAMINE LIGHT 4 G PO PACK
4.0000 g | PACK | Freq: Every day | ORAL | Status: DC
  Administered 2014-08-04 – 2014-08-06 (×3): 4 g via ORAL
  Filled 2014-08-04 (×3): qty 1

## 2014-08-04 MED ORDER — CALCIUM + D3 600-200 MG-UNIT PO TABS: 1.0000 | ORAL_TABLET | Freq: Every day | ORAL | Status: DC

## 2014-08-04 MED ORDER — SODIUM CHLORIDE 0.9 % IJ SOLN
3.0000 mL | INTRAMUSCULAR | Status: DC | PRN
  Administered 2014-08-04: 3 mL via INTRAVENOUS
  Filled 2014-08-04: qty 3

## 2014-08-04 MED ORDER — HYDROCODONE-ACETAMINOPHEN 5-325 MG PO TABS
1.0000 | ORAL_TABLET | ORAL | Status: DC | PRN
  Administered 2014-08-04 – 2014-08-06 (×6): 2 via ORAL
  Administered 2014-08-06: 1 via ORAL
  Filled 2014-08-04: qty 2
  Filled 2014-08-04: qty 1
  Filled 2014-08-04: qty 2
  Filled 2014-08-04: qty 1
  Filled 2014-08-04 (×5): qty 2

## 2014-08-04 MED ORDER — ALLOPURINOL 100 MG PO TABS
200.0000 mg | ORAL_TABLET | Freq: Every day | ORAL | Status: DC
  Administered 2014-08-04 – 2014-08-06 (×3): 200 mg via ORAL
  Filled 2014-08-04 (×3): qty 2

## 2014-08-04 MED ORDER — APIXABAN 2.5 MG PO TABS
2.5000 mg | ORAL_TABLET | Freq: Two times a day (BID) | ORAL | Status: DC
  Administered 2014-08-04 – 2014-08-06 (×5): 2.5 mg via ORAL
  Filled 2014-08-04 (×7): qty 1

## 2014-08-04 MED ORDER — SIMVASTATIN 20 MG PO TABS
20.0000 mg | ORAL_TABLET | Freq: Every day | ORAL | Status: DC
  Administered 2014-08-04 – 2014-08-05 (×2): 20 mg via ORAL
  Filled 2014-08-04 (×4): qty 1

## 2014-08-04 MED ORDER — LOPERAMIDE HCL 2 MG PO CAPS: 2.0000 mg | ORAL_CAPSULE | Freq: Every day | ORAL | Status: DC | PRN

## 2014-08-04 MED ORDER — CARBIDOPA-LEVODOPA 25-100 MG PO TABS
1.0000 | ORAL_TABLET | Freq: Three times a day (TID) | ORAL | Status: DC
  Administered 2014-08-04 – 2014-08-06 (×7): 1 via ORAL
  Filled 2014-08-04 (×9): qty 1

## 2014-08-04 MED ORDER — RIFAXIMIN 200 MG PO TABS
200.0000 mg | ORAL_TABLET | Freq: Three times a day (TID) | ORAL | Status: DC
  Administered 2014-08-04 – 2014-08-06 (×6): 200 mg via ORAL
  Filled 2014-08-04 (×10): qty 1

## 2014-08-04 MED ORDER — FUROSEMIDE 20 MG PO TABS
20.0000 mg | ORAL_TABLET | Freq: Every day | ORAL | Status: DC
  Administered 2014-08-04 – 2014-08-06 (×3): 20 mg via ORAL
  Filled 2014-08-04 (×3): qty 1

## 2014-08-04 MED ORDER — ACETAMINOPHEN 650 MG RE SUPP: 650.0000 mg | Freq: Four times a day (QID) | RECTAL | Status: DC | PRN

## 2014-08-04 MED ORDER — CALCIUM CARBONATE-VITAMIN D 500-200 MG-UNIT PO TABS
1.0000 | ORAL_TABLET | Freq: Every day | ORAL | Status: DC
  Administered 2014-08-04 – 2014-08-06 (×3): 1 via ORAL
  Filled 2014-08-04 (×4): qty 1

## 2014-08-04 MED ORDER — AMILORIDE HCL 5 MG PO TABS
5.0000 mg | ORAL_TABLET | ORAL | Status: DC
  Administered 2014-08-05: 5 mg via ORAL
  Filled 2014-08-04 (×2): qty 1

## 2014-08-04 NOTE — Progress Notes (Addendum)
Patient ID: [name], female   DOB: Sep 26, 1932, 81 y.o.   MRN: [name] [facility] PROGRESS NOTE  [name] VHQ:[id number] DOB: 1932/10/30 DOA: August 10, 2014 PCP: Melinda Ravel, MD  Brief narrative:    81 y.o. female who resides at [facility], history of coronary artery disease, hypertension, arthritis, atrial fibrillation, gout, Parkinson's disease, dementia who presented to [facility] ED STATUS post mechanical fall and pain in the right hip. On admission, CT head and cervical spine did not show acute intracranial abnormalities but there were chronic small vessel ischemic changes. X-ray of the left hip showed acute comminuted fractures of the right superior and inferior pubic rami but in no acute fractures of the left hip. X-ray of the right hip showed acute fractures of the right superior and inferior pubic rami. Patient was admitted for pain management.   Assessment/Plan:    Principal problem: Mechanical fall / acute fractures of the right superior and inferior pubic rami  Continue pain management efforts.  Appreciate physical therapy evaluation and recommendations.  Active problems: Atrial fibrillation / history of coronary artery disease  Rate controlled with metoprolol 25 mg by mouth twice a day.  On anticoagulation with apixaban  History of Parkinson's disease / dementia  Resumed Sinemet 1 tablet by mouth 3 times a day  Dyslipidemia  Continue Zocor 20 mg at bedtime  History of chronic systolic and diastolic  Last 2-D echo on file in October 2015 with ejection fraction of 35%  Continue Lasix, continue amiloride 5 mg 3 times weekly.  Continue metoprolol 25 mg by mouth twice a day, Zocor 20 mg daily  Continue anticoagulation with apixaban  Chronic kidney disease, stage III  Baseline creatinine is 1.8 and on this admission 1.5.  Continue to monitor renal function.  Anemia of chronic disease  Secondary to history of chronic kidney disease,  history of atrial fibrillation  Hemoglobin is 10.9. No current indications for transfusion.  History of gout  Continue allopurinol 200 mg daily  DVT Prophylaxis   SCDs for DVT prophylaxis  Code Status: DNR/DNI  Family Communication:  Family not at the bedside this am  Disposition Plan: Home when stable. Needs PT evaluation.    IV access:  Peripheral IV  Procedures and diagnostic studies:    Dg Hip Complete Left 08-10-2014    Acute comminuted fractures of the right superior and inferior pubic rami. No acute fractures of the left hip.     Dg Hip Complete Right 2014/08/10   Acute fractures of the right superior and inferior pubic rami.   Electronically Signed   By: [name] M.D.   On: 08/10/2014 21:55   Ct Head Wo Contrast 08/10/2014 No acute intracranial abnormalities. Chronic atrophy and small vessel ischemic changes.  Nonspecific straightening of the usual cervical lordosis. Diffuse bone demineralization and degenerative changes throughout the cervical spine. Interval development/ progression of erosive changes at the base of the odontoid process of C2 and compression of the lateral masses of C1 resulting in basilar invagination. No acute displaced fractures are demonstrated.   Ct Cervical Spine Wo Contrast Aug 10, 2014 No acute intracranial abnormalities. Chronic atrophy and small vessel ischemic changes.  Nonspecific straightening of the usual cervical lordosis. Diffuse bone demineralization and degenerative changes throughout the cervical spine. Interval development/ progression of erosive changes at the base of the odontoid process of C2 and compression of the lateral masses of C1 resulting in basilar invagination. No acute displaced fractures are demonstrated.      Medical Consultants:  None   Other Consultants:  Physical therapy  IAnti-Infectives:   None    Manson Passey, MD  [facility] Pager [phone]  If 7PM-7AM, please contact  night-coverage [email or web address] Password [email or web address] 08/04/2014, 9:08 AM   LOS: 1 day    HPI/Subjective: No acute overnight events.  Objective: Filed Vitals:   08/03/14 2230 08/03/14 2300 08/03/14 2345 08/04/14 0600  BP: 134/86  143/67 137/60  Pulse:   65 67  Temp:   99.5 F (37.5 C) 99.1 F (37.3 C)  TempSrc:   Oral Oral  Resp: Height:  5' (1.524 m)    Weight:  52.9 kg (116 lb 10 oz)    SpO2:   99% 100%    Intake/Output Summary (Last 24 hours) at 08/04/14 0908 Last data filed at 08/04/14 0600  Gross per 24 hour  Intake      0 ml  Output      0 ml  Net      0 ml    Exam:   General:  Pt is  not in acute distress  Cardiovascular: Regular rate and rhythm, S1/S2 (+), appreciate soft ejection murmur +2/6 in mid sternal area  Respiratory: no wheezing, no crackles, no rhonchi  Abdomen: Soft, non tender, non distended, bowel sounds present  Extremities: No edema, pulses DP and PT palpable bilaterally  Neuro: Grossly nonfocal  Data Reviewed: Basic Metabolic Panel:  Recent Labs Lab 08/03/14 2057 08/04/14 0522  NA 139 135  K 4.2 4.4  CL 105 104  CO2 22 23  GLUCOSE 135* 117*  BUN 30* 32*  CREATININE 1.50* 1.43*  CALCIUM 8.8 8.8  MG  --  1.9  PHOS  --  4.5   Liver Function Tests:  Recent Labs Lab 08/04/14 0522  AST 20  ALT 8  ALKPHOS 137*  BILITOT 1.2  PROT 6.5  ALBUMIN 4.2   No results for input(s): LIPASE, AMYLASE in the last 168 hours. No results for input(s): AMMONIA in the last 168 hours. CBC:  Recent Labs Lab 08/03/14 2057 08/04/14 0522  WBC 9.2 8.8  NEUTROABS 7.9*  --   HGB 11.3* 10.9*  HCT 35.4* 34.5*  MCV 100.3* 100.9*  PLT 154 124*   Cardiac Enzymes: No results for input(s): CKTOTAL, CKMB, CKMBINDEX, TROPONINI in the last 168 hours. BNP: Invalid input(s): POCBNP CBG: No results for input(s): GLUCAP in the last 168 hours.  No results found for this or any previous visit (from the past 240 hour(s)).   Scheduled Meds: .  allopurinol  200 mg Oral Daily  . [START ON 08/05/2014] aMILoride  5 mg Oral Once per day on Mon Wed Fri  . apixaban  2.5 mg Oral BID  . calcium-vitamin D  1 tablet Oral Q breakfast  . carbidopa-levodopa  1 tablet Oral TID  . cholestyramine light  4 g Oral Daily  . furosemide  20 mg Oral Daily  . magnesium oxide  400 mg Oral BID  . metoprolol  25 mg Oral BID  . pantoprazole  40 mg Oral Daily  . rifaximin  200 mg Oral TID  . simvastatin  20 mg Oral QHS  . sodium chloride  3 mL Intravenous Q12H   Continuous Infusions:

## 2014-08-04 NOTE — Progress Notes (Signed)
Spoke with Angie Cox (Daughter) who would like to make password "[email or web address]" for use with updates on phone.

## 2014-08-04 NOTE — Progress Notes (Signed)
PT Cancellation Note  Patient Details Name: [name] MRN: [id number] DOB: 07/30/1933   Cancelled Treatment:    Reason Eval/Treat Not Completed: Other (comment) (dtr politely refused, stating pt had just gotten comfortable, pain better and pt just gotten to sleep)   [facility] 08/04/2014, 12:38 PM

## 2014-08-05 DIAGNOSIS — E43 Unspecified severe protein-calorie malnutrition: Secondary | ICD-10-CM | POA: Insufficient documentation

## 2014-08-05 DIAGNOSIS — F0390 Unspecified dementia without behavioral disturbance: Secondary | ICD-10-CM

## 2014-08-05 LAB — BASIC METABOLIC PANEL
Anion gap: 5 (ref 5–15)
BUN: 39 mg/dL — AB (ref 6–23)
CO2: 24 mmol/L (ref 19–32)
Calcium: 8.9 mg/dL (ref 8.4–10.5)
Chloride: 111 mEq/L (ref 96–112)
Creatinine, Ser: 1.75 mg/dL — ABNORMAL HIGH (ref 0.50–1.10)
GFR calc Af Amer: 31 mL/min — ABNORMAL LOW (ref >90)
GFR, EST NON AFRICAN AMERICAN: 27 mL/min — AB (ref >90)
Glucose, Bld: 138 mg/dL — ABNORMAL HIGH (ref 70–99)
Potassium: 4.3 mmol/L (ref 3.5–5.1)
Sodium: 140 mmol/L (ref 135–145)

## 2014-08-05 LAB — CBC
HEMATOCRIT: 30.7 % — AB (ref 36.0–46.0)
HEMOGLOBIN: 9.8 g/dL — AB (ref 12.0–15.0)
MCH: 32.2 pg (ref 26.0–34.0)
MCHC: 31.9 g/dL (ref 30.0–36.0)
MCV: 101 fL — ABNORMAL HIGH (ref 78.0–100.0)
Platelets: 107 10*3/uL — ABNORMAL LOW (ref 150–400)
RBC: 3.04 MIL/uL — ABNORMAL LOW (ref 3.87–5.11)
RDW: 15 % (ref 11.5–15.5)
WBC: 7.7 10*3/uL (ref 4.0–10.5)

## 2014-08-05 MED ORDER — METOPROLOL TARTRATE 1 MG/ML IV SOLN
5.0000 mg | Freq: Four times a day (QID) | INTRAVENOUS | Status: DC
  Administered 2014-08-05 – 2014-08-06 (×2): 5 mg via INTRAVENOUS
  Filled 2014-08-05 (×7): qty 5

## 2014-08-05 MED ORDER — ENSURE COMPLETE PO LIQD
237.0000 mL | Freq: Two times a day (BID) | ORAL | Status: DC
  Administered 2014-08-05 – 2014-08-06 (×2): 237 mL via ORAL

## 2014-08-05 MED ORDER — HYDROCODONE-ACETAMINOPHEN 5-325 MG PO TABS: 1.0000 | ORAL_TABLET | ORAL | Status: AC | PRN

## 2014-08-05 NOTE — Progress Notes (Signed)
Clinical Social Work Department BRIEF PSYCHOSOCIAL ASSESSMENT 08/05/2014  Patient:  [name], [name]     Account Number:  [id number]     Admit date:  08/03/2014  Clinical Social Worker:  Lacie Scotts  Date/Time:  08/05/2014 04:56 PM  Referred by:  Physician  Date Referred:  08/05/2014 Referred for  SNF Placement   Other Referral:   Interview type:  Family Other interview type:    PSYCHOSOCIAL DATA Living Status:  FACILITY Admitted from facility:  Other Level of care:  Assisted Living Primary support name:  Angie Cox Primary support relationship to patient:  CHILD, ADULT Degree of support available:   supportive    CURRENT CONCERNS Current Concerns  Post-Acute Placement   Other Concerns:    SOCIAL WORK ASSESSMENT / PLAN Pt is a 81 yr old female admitted from [facility] following a fall. CSW consulted to assist with d/c planning. PN reviewed. CSW met with daughter, Frederich Cha [phone]). Pt has dementia and is unable to assist with d/c planning. Pt fell at facility and has multiple pubic rami fx's. Surgery is not recommended at this time. Pain management is required. Pt will need SNF placement following hospital d/c. Daughter has allowed CSW to initiate SNF search. Bed offers are pending. CSW will continue to follow to assist with d/c planning.   Assessment/plan status:  Psychosocial Support/Ongoing Assessment of Needs Other assessment/ plan:   Information/referral to community resources:   Insurance coverage for SNF and ambulance transport reviewed. Pt has [employer] which requires prior authorization. Clinicals have been submitted and authorization is pending.    PATIENT'S/FAMILY'S RESPONSE TO PLAN OF CARE: Daughter was upset during her visit. Support provided. " It's so hard to see my mother like this". Pt has been to [facility] in the past and family would like her to return. SNF has been contacted and a decision will be available in the am. Daughter  understands additional bed offers will be provided if [facility] is unable to assist.    Werner Lean LCSW [phone]

## 2014-08-05 NOTE — Progress Notes (Signed)
INITIAL NUTRITION ASSESSMENT  DOCUMENTATION CODES Per approved criteria  -Severe malnutrition in the context of chronic illness  Pt meets criteria for severe MALNUTRITION in the context of chronic illness as evidenced by severe muscle and fat depletion.  INTERVENTION: -Provide Ensure Complete po BID, each supplement provides 350 kcal and 13 grams of protein -Encourage PO intake -RD to continue to monitor  NUTRITION DIAGNOSIS: Inadequate oral intake related to dementia as evidenced by PO intake <50%.   Goal: Pt to meet >/= 90% of their estimated nutrition needs   Monitor:  PO and supplemental intake, weight, labs, I/O's  Reason for Assessment: Pt identified as at nutrition risk on the Malnutrition Screen Tool  Admitting Dx: Fall, hip pain  ASSESSMENT: 81 y.o. female has a past medical history of Coronary artery disease; Hypertension; Heart murmur; Shortness of breath; Dysrhythmia; Arthritis; Neuromuscular disorder; CHF (congestive heart failure); Atrial fibrillation; Gout; Osteoporosis; Hypomagnesemia; Mild memory disturbance; Peptic ulcer disease; Degenerative arthritis; Thyroid goiter; and Parkinson disease.   Pt unable to provide history at this time.   PO intake: 40% of regular diet.  Per weight history documentation, pt's weight is stable.  Nutrition Focused Physical Exam:  Subcutaneous Fat:  Orbital Region: WNL Upper Arm Region: severe depletion Thoracic and Lumbar Region: NA  Muscle:  Temple Region: moderate depletion Clavicle Bone Region: severe depletion Clavicle and Acromion Bone Region: severe depletion Scapular Bone Region: severe depletion Dorsal Hand: severe depletion Patellar Region: NA Anterior Thigh Region: NA Posterior Calf Region: NA  Edema: non-pitting generalized, RLE, LLE  Pt would benefit from nutritional supplementation given poor PO intake, and malnutrition. RD to order Ensure supplements.  Labs reviewed: Elevated BUN &  Creatinine MG/Phos WNL  Height: Ht Readings from Last 1 Encounters:  08/03/14 5' (1.524 m)    Weight: Wt Readings from Last 1 Encounters:  08/03/14 116 lb 10 oz (52.9 kg)    Ideal Body Weight: 100 lb  % Ideal Body Weight: 116%  Wt Readings from Last 10 Encounters:  08/03/14 116 lb 10 oz (52.9 kg)  06/26/14 119 lb 3.2 oz (54.069 kg)  06/10/14 112 lb (50.803 kg)  05/19/14 117 lb 1 oz (53.1 kg)  02/15/14 113 lb (51.256 kg)  08/13/13 120 lb (54.432 kg)  06/19/12 114 lb (51.71 kg)  04/11/12 114 lb 3.2 oz (51.8 kg)    Usual Body Weight: unable to obtain  % Usual Body Weight: NA  BMI:  Body mass index is 22.78 kg/(m^2).  Estimated Nutritional Needs: Kcal: 1550-1750 Protein: 75-85g Fluid: 1.5L/day  Skin: MSAD on sacrum  Diet Order: Diet regular  EDUCATION NEEDS: -No education needs identified at this time   Intake/Output Summary (Last 24 hours) at 08/05/14 0952 Last data filed at 08/05/14 0907  Gross per 24 hour  Intake    300 ml  Output      0 ml  Net    300 ml    Last BM: PTA  Labs:   Recent Labs Lab 08/03/14 2057 08/04/14 0522  NA 139 135  K 4.2 4.4  CL 105 104  CO2 22 23  BUN 30* 32*  CREATININE 1.50* 1.43*  CALCIUM 8.8 8.8  MG  --  1.9  PHOS  --  4.5  GLUCOSE 135* 117*    CBG (last 3)  No results for input(s): GLUCAP in the last 72 hours.  Scheduled Meds: . allopurinol  200 mg Oral Daily  . aMILoride  5 mg Oral Once per day on Mon Wed Fri  .  apixaban  2.5 mg Oral BID  . calcium-vitamin D  1 tablet Oral Q breakfast  . carbidopa-levodopa  1 tablet Oral TID  . cholestyramine light  4 g Oral Daily  . furosemide  20 mg Oral Daily  . magnesium oxide  400 mg Oral BID  . metoprolol  25 mg Oral BID  . pantoprazole  40 mg Oral Daily  . rifaximin  200 mg Oral TID  . simvastatin  20 mg Oral QHS  . sodium chloride  3 mL Intravenous Q12H    Continuous Infusions:   Past Medical History  Diagnosis Date  . Coronary artery disease   .  Hypertension   . Heart murmur   . Shortness of breath   . Dysrhythmia     atrial fib  . Arthritis   . Neuromuscular disorder     parkinsons  . CHF (congestive heart failure)   . Atrial fibrillation   . Gout   . Osteoporosis   . Hypomagnesemia     Hx.  . Mild memory disturbance   . Peptic ulcer disease   . Degenerative arthritis   . Thyroid goiter     resection  . Parkinson disease     Past Surgical History  Procedure Laterality Date  . Pacemaker placement    . Insert / replace / remove pacemaker    . Abdominal hysterectomy    . Cholecystectomy    . Toe nails      removed  . Bladder tack    . Appendectomy    . Tonsillectomy    . Kyphoplasty      Compression fracture, lumbosacral spine  . Rectocele repair    . Cataract extraction, bilateral    . Hiatal hernia repair      Tilda Franco, MS, RD, LDN Pager: [phone] After Hours Pager: [phone]

## 2014-08-05 NOTE — Evaluation (Signed)
Physical Therapy Evaluation Patient Details Name: [name] MRN: [id number] DOB: 1932/08/12 Today's Date: 08/05/2014   History of Present Illness  Patient resides at [facility] today she had a mechanical fall hitting her right hip. Patient was unable to bear weight. Was brought in to [facility] along emergency department. Images of the right hip showed Acute fractures of the right superior and inferior pubic rami. Patient has history of Parkinson disease and dementia  Clinical Impression  Pt admitted with above diagnosis. Pt currently with functional limitations due to the deficits listed below (see PT Problem List).  Pt will benefit from skilled PT to increase their independence and safety with mobility to allow discharge to the venue listed below.  Pt is MAX to TOT A and will need SNF for rehab after d/c from acute care.     Follow Up Recommendations SNF    Equipment Recommendations  None recommended by PT    Recommendations for Other Services       Precautions / Restrictions Precautions Precautions: Fall Restrictions Weight Bearing Restrictions: No      Mobility  Bed Mobility Overal bed mobility: Needs Assistance Bed Mobility: Supine to Sit     Supine to sit: Max assist        Transfers Overall transfer level: Needs assistance Equipment used: Rolling walker (2 wheeled) Transfers: Sit to/from [employer] Sit to Stand: Max assist Stand pivot transfers: Total assist       General transfer comment: Attempted to stand with RW, but pt unable to take steps with it and taking hands off.  Ambulation/Gait                Stairs            Wheelchair Mobility    Modified Rankin (Stroke Patients Only)       Balance Overall balance assessment: Needs assistance   Sitting balance-Leahy Scale: Fair       Standing balance-Leahy Scale: Zero                               Pertinent Vitals/Pain Pain Assessment:  Faces Faces Pain Scale: Hurts even more Pain Location: grimacing during transfers  Pain Descriptors / Indicators: Guarding    Home Living Family/patient expects to be discharged to:: Skilled nursing facility                      Prior Function           Comments: Amb at ALF with RW     Hand Dominance        Extremity/Trunk Assessment   Upper Extremity Assessment: Defer to OT evaluation           Lower Extremity Assessment: Generalized weakness         Communication      Cognition Arousal/Alertness: Lethargic Behavior During Therapy: Flat affect Overall Cognitive Status: History of cognitive impairments - at baseline                      General Comments General comments (skin integrity, edema, etc.): Pt fatigued quickly during eval.      Exercises        Assessment/Plan    PT Assessment Patient needs continued PT services  PT Diagnosis Difficulty walking;Generalized weakness   PT Problem List Decreased activity tolerance;Decreased balance;Decreased mobility;Decreased knowledge of use of DME;Decreased safety awareness;Decreased coordination;Decreased strength;Decreased  range of motion  PT Treatment Interventions Gait training;Functional mobility training;Therapeutic activities;Therapeutic exercise;DME instruction;Balance training   PT Goals (Current goals can be found in the Care Plan section) Acute Rehab PT Goals Patient Stated Goal: none stated PT Goal Formulation: Patient unable to participate in goal setting Time For Goal Achievement: 08/19/14 Potential to Achieve Goals: Good    Frequency Min 3X/week   Barriers to discharge        Co-evaluation               End of Session Equipment Utilized During Treatment: Gait belt;Oxygen Activity Tolerance: Patient limited by fatigue Patient left: in chair;with call bell/phone within reach;with chair alarm set;with nursing/sitter in room Nurse Communication: Mobility status  (spoke with NT and rec +2 to get back to bed)         Time: 1610-9604 PT Time Calculation (min) (ACUTE ONLY): 29 min   Charges:   PT Evaluation $Initial PT Evaluation Tier I: 1 Procedure PT Treatments $Therapeutic Activity: 8-22 mins   PT G Codes:        Feliz Herard LUBECK 08/05/2014, 12:26 PM

## 2014-08-05 NOTE — Progress Notes (Signed)
CSW consulted to assist with d/c planning. Pt with dementia, from ALF,  is unable to assist with d/c planning. CSW has been unable to reach family. Message left for daughter, [name], [phone] / [phone] )  to contact CSW. ALF contacted and csw has been informed that pt has been d/c, at family's request, due to need for SNF placement. CSW will continue to try to reach family to assist with d/c planning needs.  Cori Razor LCSW [phone]

## 2014-08-05 NOTE — Evaluation (Signed)
Occupational Therapy Evaluation Patient Details Name: [name] MRN: [id number] DOB: [birth year] Today's Date: 08/05/2014    History of Present Illness Pt admitted after falling at ALF and sustaining acute fractures of Rt superior and inferior pubic rami.  PMH includes: dementia; Parkinson's; A-Fib; CHF; CKD; h/o gout   Clinical Impression   Pt admitted with above. She demonstrates the below listed deficits and will benefit from continued OT to maximize safety and independence with BADLs.  Pt presents to OT with generalized weakness, lethargy, and impaired cognition.  Pt unable to complete simple grooming tasks this date and requires max A for sit to stand - overall, requires total A for BADLs.  Recommend SNF level rehab at discharge.  .      Follow Up Recommendations  SNF    Equipment Recommendations  None recommended by OT    Recommendations for Other Services       Precautions / Restrictions Precautions Precautions: Fall Restrictions Weight Bearing Restrictions: No      Mobility Bed Mobility Overal bed mobility: Needs Assistance Bed Mobility: Supine to Sit     Supine to sit: Max assist        Transfers Overall transfer level: Needs assistance Equipment used: Rolling walker (2 wheeled) Transfers: Sit to/from Stand Sit to Stand: Max assist Stand pivot transfers: Total assist       General transfer comment: Pt moved sit to partial stand with max A.  Unable to move fully into standing     Balance Overall balance assessment: Needs assistance   Sitting balance-Leahy Scale: Poor Sitting balance - Comments: Pt requires min - mod A to maintain sitting balance EOB      Standing balance-Leahy Scale: Zero                              ADL Overall ADL's : Needs assistance/impaired Eating/Feeding: Total assistance;Sitting   Grooming: Wash/dry hands;Wash/dry face;Oral care;Brushing hair;Total assistance;Sitting   Upper Body Bathing: Total  assistance;Sitting   Lower Body Bathing: Total assistance;Sit to/from stand   Upper Body Dressing : Total assistance;Sitting   Lower Body Dressing: Total assistance;Sit to/from stand   Toilet Transfer: Total assistance;BSC   Toileting- Clothing Manipulation and Hygiene: Total assistance;Sit to/from stand       Functional mobility during ADLs: Total assistance General ADL Comments: Pt unable to perform basic grooming despite hand over hand assist     Vision                     Perception     Praxis      Pertinent Vitals/Pain Pain Assessment: Faces Faces Pain Scale: Hurts little more Pain Location: Grimaces with attempts to move.  Does not indicate location  Pain Descriptors / Indicators: Grimacing Pain Intervention(s): Limited activity within patient's tolerance;Repositioned     Hand Dominance     Extremity/Trunk Assessment Upper Extremity Assessment Upper Extremity Assessment: RUE deficits/detail;LUE deficits/detail RUE Deficits / Details: PROM WFl.  Pt spontaneously moving elbow distally.  Tremor noted.  Unable to fully assess due to lethargy  LUE Deficits / Details: PROM WFl.  Pt spontaneously moving elbow distally.  Tremor noted.  Unable to fully assess due to lethargy   Lower Extremity Assessment Lower Extremity Assessment: Defer to PT evaluation   Cervical / Trunk Assessment Cervical / Trunk Assessment: Kyphotic   Communication Communication Communication: Other (comment) (Pt's only words were "oh me")   Cognition Arousal/Alertness: Lethargic Behavior  During Therapy: Flat affect Overall Cognitive Status: No family/caregiver present to determine baseline cognitive functioning Area of Impairment: Orientation;Attention;Following commands Orientation Level: Disoriented to;Person;Place;Time;Situation Current Attention Level: Focused   Following Commands: Follows one step commands inconsistently       General Comments: Pt lethargic.  Follows  commands to sit forward, but followed no other commands.  Keeps eyes closed throughout session.  Able to state her first name, but not her last, and the only other verbalization, besides groaning, was "oh me"   General Comments       Exercises       Shoulder Instructions      Home Living Family/patient expects to be discharged to:: Skilled nursing facility                                        Prior Functioning/Environment          Comments: No family present to provide info re: pt's ability to perform BADLs    OT Diagnosis: Generalized weakness;Cognitive deficits;Acute pain   OT Problem List: Decreased strength;Decreased activity tolerance;Impaired balance (sitting and/or standing);Decreased cognition;Decreased knowledge of use of DME or AE;Decreased knowledge of precautions;Pain   OT Treatment/Interventions: Self-care/ADL training;DME and/or AE instruction;Therapeutic activities;Cognitive remediation/compensation;Patient/family education;Balance training    OT Goals(Current goals can be found in the care plan section) Acute Rehab OT Goals Patient Stated Goal: none stated OT Goal Formulation: Patient unable to participate in goal setting Time For Goal Achievement: 08/19/14 Potential to Achieve Goals: Fair ADL Goals Pt Will Perform Grooming: with min assist;sitting Pt Will Perform Upper Body Bathing: with min assist;sitting Pt Will Perform Lower Body Bathing: with mod assist;sit to/from stand Pt Will Transfer to Toilet: with mod assist;stand pivot transfer;bedside commode Pt Will Perform Toileting - Clothing Manipulation and hygiene: with max assist;sit to/from stand  OT Frequency: Min 2X/week   Barriers to D/C: Decreased caregiver support          Co-evaluation              End of Session Nurse Communication: Mobility status  Activity Tolerance: Patient limited by lethargy Patient left: in chair;with call bell/phone within reach;with chair  alarm set   Time: 1251-1301 OT Time Calculation (min): 10 min Charges:  OT General Charges $OT Visit: 1 Procedure OT Evaluation $Initial OT Evaluation Tier I: 1 Procedure G-Codes:    Mendel Binsfeld M 2014-08-13, 1:16 PM

## 2014-08-05 NOTE — Progress Notes (Signed)
Clinical Social Work Department CLINICAL SOCIAL WORK PLACEMENT NOTE 08/05/2014  Patient:  [name], [name]  Account Number:  [id number] Admit date:  08/03/2014  Clinical Social Worker:  Cori Razor, LCSW  Date/time:  08/05/2014 12:00 M  Clinical Social Work is seeking post-discharge placement for this patient at the following level of care:   SKILLED NURSING   (*CSW will update this form in Epic as items are completed)   08/05/2014  Patient/family provided with [facility] Department of Clinical Social Work's list of facilities offering this level of care within the geographic area requested by the patient (or if unable, by the patient's family).    Patient/family informed of their freedom to choose among providers that offer the needed level of care, that participate in Medicare, Medicaid or managed care program needed by the patient, have an available bed and are willing to accept the patient.    Patient/family informed of [facility]' ownership interest in [facility], as well as of the fact that they are under no obligation to receive care at this facility.  PASARR submitted to EDS on 08/05/2014 PASARR number received on 08/05/2014  FL2 transmitted to all facilities in geographic area requested by pt/family on  08/05/2014 FL2 transmitted to all facilities within larger geographic area on   Patient informed that his/her managed care company has contracts with or will negotiate with  certain facilities, including the following:     Patient/family informed of bed offers received:   Patient chooses bed at  Physician recommends and patient chooses bed at    Patient to be transferred to  on   Patient to be transferred to facility by  Patient and family notified of transfer on  Name of family member notified:    The following physician request were entered in Epic:   Additional Comments:  Cori Razor LCSW [phone]

## 2014-08-05 NOTE — Progress Notes (Addendum)
Patient ID: [name], female   DOB: 18-Nov-1932, 81 y.o.   MRN: [id number] [facility] PROGRESS NOTE  [name] AOZ:[id number] DOB: 1933/01/25 DOA: 08/27/14 PCP: Ailene Ravel, MD  Brief narrative:    81 y.o. female who resides at [facility], history of coronary artery disease, hypertension, arthritis, atrial fibrillation, gout, Parkinson's disease, dementia who presented to [facility] ED STATUS post mechanical fall and pain in the right hip. On admission, CT head and cervical spine did not show acute intracranial abnormalities but there were chronic small vessel ischemic changes. X-ray of the left hip showed acute comminuted fractures of the right superior and inferior pubic rami but in no acute fractures of the left hip. X-ray of the right hip showed acute fractures of the right superior and inferior pubic rami. Patient was admitted for pain management.   Assessment/Plan:     Principal problem: Mechanical fall / acute fractures of the right superior and inferior pubic rami  Continue pain management efforts.  Appreciate physical therapy evaluation and recommendations - pt unable to participate yesterday but was encouraged to do it today.  Active problems: Atrial fibrillation / history of coronary artery disease  Rate controlled with metoprolol 25 mg by mouth twice a day.  On anticoagulation with apixaban  History of Parkinson's disease / dementia  Continue Sinemet 1 tablet by mouth 3 times a day  Dyslipidemia  Continue Zocor 20 mg at bedtime  History of chronic systolic and diastolic  Last 2-D echo on file in October 2015 with ejection fraction of 35%  Continue Lasix, continue amiloride 5 mg 3 times weekly.  Continue metoprolol 25 mg by mouth twice a day, Zocor 20 mg daily  Continue anticoagulation with apixaban  Chronic kidney disease, stage III  Baseline creatinine is 1.8 and on this admission 1.5.  Continue to monitor renal  function.  Anemia of chronic disease  Secondary to history of chronic kidney disease, history of atrial fibrillation  Hemoglobin is 10.9. No current indications for transfusion.  History of gout  Continue allopurinol 200 mg daily  Severe protein calorie malnutrition  Nutrition consulted   DVT Prophylaxis   SCDs for DVT prophylaxis  Code Status: DNR/DNI  Family Communication:  Family not at the bedside this am  Disposition Plan: Home when stable. Needs PT evaluation.    IV access:  Peripheral IV  Procedures and diagnostic studies:    Dg Hip Complete Left August 27, 2014    Acute comminuted fractures of the right superior and inferior pubic rami. No acute fractures of the left hip.     Dg Hip Complete Right 2014-08-27   Acute fractures of the right superior and inferior pubic rami.   Electronically Signed   By: Kennith Center M.D.   On: 2014-08-27 21:55   Ct Head Wo Contrast 08-27-2014 No acute intracranial abnormalities. Chronic atrophy and small vessel ischemic changes.  Nonspecific straightening of the usual cervical lordosis. Diffuse bone demineralization and degenerative changes throughout the cervical spine. Interval development/ progression of erosive changes at the base of the odontoid process of C2 and compression of the lateral masses of C1 resulting in basilar invagination. No acute displaced fractures are demonstrated.   Ct Cervical Spine Wo Contrast 08-27-2014 No acute intracranial abnormalities. Chronic atrophy and small vessel ischemic changes.  Nonspecific straightening of the usual cervical lordosis. Diffuse bone demineralization and degenerative changes throughout the cervical spine. Interval development/ progression of erosive changes at the base of the odontoid process of C2 and compression  of the lateral masses of C1 resulting in basilar invagination. No acute displaced fractures are demonstrated.      Medical Consultants:  None   Other Consultants:  Physical  therapy  IAnti-Infectives:   None   Manson Passey, MD  [facility] Pager [phone]  If 7PM-7AM, please contact night-coverage [email or web address] Password [email or web address] 08/05/2014, 11:12 AM   LOS: 2 days    HPI/Subjective: No acute overnight events.  Objective: Filed Vitals:   08/04/14 1554 08/04/14 2145 08/05/14 0559 08/05/14 1052  BP: 124/56 131/72 141/65 117/57  Pulse: 63 64 65 65  Temp: 98.4 F (36.9 C) 98.6 F (37 C) 98.1 F (36.7 C)   TempSrc: Oral Oral Oral   Resp: Height:      Weight:      SpO2: 94% 93% 97%     Intake/Output Summary (Last 24 hours) at 08/05/14 1112 Last data filed at 08/05/14 1036  Gross per 24 hour  Intake    360 ml  Output      0 ml  Net    360 ml    Exam:   General:  Pt is alert, follows commands appropriately, not in acute distress  Cardiovascular: Regular rate and rhythm, S1/S2 (+), SEM +2/6  Respiratory: Clear to auscultation bilaterally, no wheezing, no crackles, no rhonchi  Abdomen: Soft, non tender, non distended, bowel sounds present  Extremities: No edema, pulses DP and PT palpable bilaterally  Neuro: Grossly nonfocal  Data Reviewed: Basic Metabolic Panel:  Recent Labs Lab 08/03/14 2057 08/04/14 0522  NA 139 135  K 4.2 4.4  CL 105 104  CO2 22 23  GLUCOSE 135* 117*  BUN 30* 32*  CREATININE 1.50* 1.43*  CALCIUM 8.8 8.8  MG  --  1.9  PHOS  --  4.5   Liver Function Tests:  Recent Labs Lab 08/04/14 0522  AST 20  ALT 8  ALKPHOS 137*  BILITOT 1.2  PROT 6.5  ALBUMIN 4.2   No results for input(s): LIPASE, AMYLASE in the last 168 hours. No results for input(s): AMMONIA in the last 168 hours. CBC:  Recent Labs Lab 08/03/14 2057 08/04/14 0522  WBC 9.2 8.8  NEUTROABS 7.9*  --   HGB 11.3* 10.9*  HCT 35.4* 34.5*  MCV 100.3* 100.9*  PLT 154 124*   Cardiac Enzymes: No results for input(s): CKTOTAL, CKMB, CKMBINDEX, TROPONINI in the last 168 hours. BNP: Invalid input(s): POCBNP CBG: No  results for input(s): GLUCAP in the last 168 hours.  No results found for this or any previous visit (from the past 240 hour(s)).   Scheduled Meds: . allopurinol  200 mg Oral Daily  . aMILoride  5 mg Oral Once per day on Mon Wed Fri  . apixaban  2.5 mg Oral BID  . calcium-vitamin D  1 tablet Oral Q breakfast  . carbidopa-levodopa  1 tablet Oral TID  . cholestyramine light  4 g Oral Daily  . furosemide  20 mg Oral Daily  . magnesium oxide  400 mg Oral BID  . metoprolol  25 mg Oral BID  . pantoprazole  40 mg Oral Daily  . rifaximin  200 mg Oral TID  . simvastatin  20 mg Oral QHS  . sodium chloride  3 mL Intravenous Q12H   Continuous Infusions:

## 2014-08-06 LAB — BASIC METABOLIC PANEL
ANION GAP: 8 (ref 5–15)
BUN: 40 mg/dL — ABNORMAL HIGH (ref 6–23)
CALCIUM: 8.6 mg/dL (ref 8.4–10.5)
CO2: 25 mmol/L (ref 19–32)
Chloride: 108 mEq/L (ref 96–112)
Creatinine, Ser: 1.67 mg/dL — ABNORMAL HIGH (ref 0.50–1.10)
GFR calc Af Amer: 33 mL/min — ABNORMAL LOW (ref >90)
GFR calc non Af Amer: 28 mL/min — ABNORMAL LOW (ref >90)
Glucose, Bld: 103 mg/dL — ABNORMAL HIGH (ref 70–99)
Potassium: 4.3 mmol/L (ref 3.5–5.1)
Sodium: 141 mmol/L (ref 135–145)

## 2014-08-06 LAB — CBC
HCT: 29.6 % — ABNORMAL LOW (ref 36.0–46.0)
Hemoglobin: 9.5 g/dL — ABNORMAL LOW (ref 12.0–15.0)
MCH: 32.6 pg (ref 26.0–34.0)
MCHC: 32.1 g/dL (ref 30.0–36.0)
MCV: 101.7 fL — AB (ref 78.0–100.0)
PLATELETS: 92 10*3/uL — AB (ref 150–400)
RBC: 2.91 MIL/uL — ABNORMAL LOW (ref 3.87–5.11)
RDW: 14.9 % (ref 11.5–15.5)
WBC: 6 10*3/uL (ref 4.0–10.5)

## 2014-08-06 MED ORDER — RIFAXIMIN 200 MG PO TABS: 200.0000 mg | ORAL_TABLET | Freq: Three times a day (TID) | ORAL | Status: DC

## 2014-08-06 MED ORDER — ENSURE COMPLETE PO LIQD: 237.0000 mL | Freq: Two times a day (BID) | ORAL | Status: AC

## 2014-08-06 NOTE — Progress Notes (Signed)
[facility] in [place]. Spoke with Marcelino DusterMichelle, RN regarding current pt status, recent vital signs taken and made aware that EMS had been notified for transport.

## 2014-08-06 NOTE — Discharge Summary (Signed)
Physician Discharge Summary  LOUSIE CALICO WUJ:[id number] DOB: 03-27-1933 DOA: 08/03/2014  PCP: Ailene Ravel, MD  Admit date: 08/03/2014 Discharge date: 08/06/2014  Recommendations for Outpatient Follow-up:   Check CBC and BMP per SNF protocol.  Discharge Diagnoses:  Active Problems:   A-fib   Coagulopathy   Coronary artery disease   CKD (chronic kidney disease) stage 3, GFR 30-59 ml/min   Parkinson disease   Chronic systolic heart failure   Dementia   Closed fracture of multiple pubic rami   Debility   Fracture of multiple pubic rami   Chronic diarrhea   Protein-calorie malnutrition, severe    Discharge Condition: stable   Diet recommendation: as tolerated   History of present illness:  81 y.o. female who resides at [facility], history of coronary artery disease, hypertension, arthritis, atrial fibrillation, gout, Parkinson's disease, dementia who presented to [facility] post mechanical fall and pain in the right hip. On admission, CT head and cervical spine did not show acute intracranial abnormalities but there were chronic small vessel ischemic changes. X-ray of the left hip showed acute comminuted fractures of the right superior and inferior pubic rami but in no acute fractures of the left hip. X-ray of the right hip showed acute fractures of the right superior and inferior pubic rami. Patient was admitted for pain management.  Assessment/Plan:     Principal problem: Mechanical fall / acute fractures of the right superior and inferior pubic rami  Continue pain management efforts.  Appreciate physical therapy evaluation and recommendations   Active problems: Atrial fibrillation / history of coronary artery disease  Patient will continue metoprolol twice daily as per prior home regimen.  On anticoagulation with apixaban  History of Parkinson's disease / dementia  Continue Sinemet 1 tablet by mouth 3 times a day  Dyslipidemia  Continue  Zocor 20 mg at bedtime  History of chronic systolic and diastolic  Last 2-D echo on file in October 2015 with ejection fraction of 35%  Continue Lasix, continue amiloride 5 mg 3 times weekly.  Continue metoprolol 25 mg by mouth twice a day, Zocor 20 mg daily  Continue anticoagulation with apixaban  Chronic kidney disease, stage III  Baseline creatinine is 1.8 and on this admission 1.5.  Anemia of chronic disease  Secondary to history of chronic kidney disease, history of atrial fibrillation  Hemoglobin is 10.9. No current indications for transfusion.  History of gout  Continue allopurinol 200 mg daily  Severe protein calorie malnutrition  Nutrition consulted  DVT Prophylaxis   SCDs for DVT prophylaxis  Code Status: DNR/DNI  Family Communication: Family not at the bedside this am    IV access:  Peripheral IV  Procedures and diagnostic studies:   Dg Hip Complete Left 08/03/2014 Acute comminuted fractures of the right superior and inferior pubic rami. No acute fractures of the left hip.   Dg Hip Complete Right 08/03/2014 Acute fractures of the right superior and inferior pubic rami. Electronically Signed By: Kennith Center M.D. On: 08/03/2014 21:55   Ct Head Wo Contrast 08/03/2014 No acute intracranial abnormalities. Chronic atrophy and small vessel ischemic changes. Nonspecific straightening of the usual cervical lordosis. Diffuse bone demineralization and degenerative changes throughout the cervical spine. Interval development/ progression of erosive changes at the base of the odontoid process of C2 and compression of the lateral masses of C1 resulting in basilar invagination. No acute displaced fractures are demonstrated.   Ct Cervical Spine Wo Contrast 08/03/2014 No acute intracranial abnormalities. Chronic atrophy  and small vessel ischemic changes. Nonspecific straightening of the usual cervical lordosis. Diffuse bone demineralization and degenerative  changes throughout the cervical spine. Interval development/ progression of erosive changes at the base of the odontoid process of C2 and compression of the lateral masses of C1 resulting in basilar invagination. No acute displaced fractures are demonstrated.    Medical Consultants:  None   Other Consultants:  Physical therapy  IAnti-Infectives:   None     Signed:  Manson Passey, MD  [facility] 08/06/2014, 10:22 AM  Pager #: [phone]   Discharge Exam: Filed Vitals:   08/06/14 0605  BP: 133/66  Pulse: 65  Temp: 98.1 F (36.7 C)  Resp: 16   Filed Vitals:   08/05/14 1923 08/05/14 2125 08/05/14 2339 08/06/14 0605  BP:  107/92 133/66 133/66  Pulse:  65 64 65  Temp:  97.9 F (36.6 C)  98.1 F (36.7 C)  TempSrc:  Oral  Axillary  Resp:  16  16  Height:      Weight:      SpO2: 95% 94% 97% 99%    General: Pt is  not in acute distress Cardiovascular: Irregular heart rate, S1/S2 (+) Respiratory: no wheezing, no crackles, no rhonchi Abdominal: non distended, bowel sounds +, no guarding Extremities: no cyanosis, pulses palpable bilaterally DP and PT Neuro: Grossly nonfocal  Discharge Instructions  Discharge Instructions    Call MD for:  difficulty breathing, headache or visual disturbances    Complete by:  As directed      Call MD for:  persistant dizziness or light-headedness    Complete by:  As directed      Call MD for:  persistant nausea and vomiting    Complete by:  As directed      Call MD for:  severe uncontrolled pain    Complete by:  As directed      Diet - low sodium heart healthy    Complete by:  As directed      Increase activity slowly    Complete by:  As directed             Medication List    TAKE these medications        acetaminophen 500 MG tablet  Commonly known as:  TYLENOL  Take 1,000 mg by mouth every 6 (six) hours as needed for moderate pain.     allopurinol 100 MG tablet  Commonly known as:  ZYLOPRIM  Take  200 mg by mouth daily.     alum & mag hydroxide-simeth 200-200-20 MG/5ML suspension  Commonly known as:  MAALOX/MYLANTA  Take 30 mLs by mouth every 6 (six) hours as needed for indigestion or heartburn.     aMILoride 5 MG tablet  Commonly known as:  MIDAMOR  Take 5 mg by mouth 3 (three) times a week. Monday, Wednesday and Friday.     apixaban 2.5 MG Tabs tablet  Commonly known as:  ELIQUIS  Take 1 tablet (2.5 mg total) by mouth 2 (two) times daily.     bismuth subsalicylate 262 MG/15ML suspension  Commonly known as:  PEPTO BISMOL  Take 10 mLs by mouth every 8 (eight) hours as needed for indigestion.     Calcium + D3 600-200 MG-UNIT Tabs  Take 1 tablet by mouth daily.     carbidopa-levodopa 25-100 MG per tablet  Commonly known as:  SINEMET IR  Take 1 tablet by mouth 3 (three) times daily.     feeding supplement (ENSURE COMPLETE)  Liqd  Take 237 mLs by mouth 2 (two) times daily between meals.     furosemide 20 MG tablet  Commonly known as:  LASIX  Take 20 mg by mouth daily.     guaiFENesin 100 MG/5ML liquid  Commonly known as:  ROBITUSSIN  Take 200 mg by mouth 3 (three) times daily as needed for cough.     HYDROcodone-acetaminophen 5-325 MG per tablet  Commonly known as:  NORCO/VICODIN  Take 1-2 tablets by mouth every 4 (four) hours as needed for moderate pain.     loperamide 2 MG capsule  Commonly known as:  IMODIUM  Take 2 mg by mouth daily as needed for diarrhea or loose stools.     magnesium oxide 400 MG tablet  Commonly known as:  MAG-OX  Take 400 mg by mouth 2 (two) times daily. Taking 3 (400 mg) tablets twice daily     metoprolol tartrate 25 MG tablet  Commonly known as:  LOPRESSOR  Take 1 tablet (25 mg total) by mouth 2 (two) times daily.     pantoprazole 40 MG tablet  Commonly known as:  PROTONIX  Take 1 tablet (40 mg total) by mouth daily.     PREVALITE 4 G packet  Generic drug:  cholestyramine light  Take 4 g by mouth daily.     rifaximin 200 MG  tablet  Commonly known as:  XIFAXAN  Take 1 tablet (200 mg total) by mouth 3 (three) times daily.     senna-docusate 8.6-50 MG per tablet  Commonly known as:  Senokot-S  Take 1 tablet by mouth at bedtime as needed for moderate constipation.     simvastatin 20 MG tablet  Commonly known as:  ZOCOR  Take 1 tablet (20 mg total) by mouth at bedtime.           Follow-up Information    Follow up with [facility], MD. Schedule an appointment as soon as possible for a visit in 2 weeks.   Specialty:  Family Medicine   Why:  Follow up appt after recent hospitalization   Contact information:   Dr. Burnell Blanks [address] [place] [address] [phone]        The results of significant diagnostics from this hospitalization (including imaging, microbiology, ancillary and laboratory) are listed below for reference.    Significant Diagnostic Studies: Dg Hip Complete Left  08/03/2014   CLINICAL DATA:  Fall without loss of consciousness 06 p.m. this evening. Trip and fall injury, striking right hip on the floor.  EXAM: LEFT HIP - COMPLETE 2+ VIEW  COMPARISON:  None.  FINDINGS: Acute comminuted fractures of the right superior and inferior pubic rami. Diffuse bone demineralization. Degenerative changes in the lower lumbar spine and hips. Left hip appears intact without displaced fracture identified. Extensive vascular calcifications.  IMPRESSION: Acute comminuted fractures of the right superior and inferior pubic rami. No acute fractures of the left hip.   Electronically Signed   By: Burman Nieves M.D.   On: 08/03/2014 21:55   Dg Hip Complete Right  08/03/2014   CLINICAL DATA:  Initial encounter for tripped and fall walking to bathroom. Hip right hip on floor.  EXAM: RIGHT HIP - COMPLETE 2+ VIEW  COMPARISON:  12/01/2010.  FINDINGS: AP and frog-leg lateral views of the right hip show a comminuted fracture of the right superior pubic ramus. There is associated inferior pubic  ramus on the right.  No evidence for femoral neck fracture.  Bones are diffusely  demineralized.  IMPRESSION: Acute fractures of the right superior and inferior pubic rami.   Electronically Signed   By: Kennith Center M.D.   On: 08/03/2014 21:55   Ct Head Wo Contrast  08/03/2014   CLINICAL DATA:  Fall without loss of consciousness at 6 p.m. this evening. Trip and fall injury. Pain. Confusion and dementia is chronic.  EXAM: CT HEAD WITHOUT CONTRAST  CT CERVICAL SPINE WITHOUT CONTRAST  TECHNIQUE: Multidetector CT imaging of the head and cervical spine was performed following the standard protocol without intravenous contrast. Multiplanar CT image reconstructions of the cervical spine were also generated.  COMPARISON:  CT head 05/20/2014. CT head and cervical spine 12/01/2010.  FINDINGS: CT HEAD FINDINGS  Diffuse cerebral atrophy. Mild ventricular dilatation consistent with central atrophy. Low-attenuation changes throughout the deep white matter consistent with small vessel ischemia. Patchy areas of encephalomalacia in the left frontal lobe consistent with old infarct and unchanged since prior study. No mass effect or midline shift. No abnormal extra-axial fluid collections. Gray-white matter junctions are distinct. Basal cisterns are not effaced. No evidence of acute intracranial hemorrhage. No depressed skull fractures. Visualized paranasal sinuses and mastoid air cells are not opacified. Vascular calcifications.  CT CERVICAL SPINE FINDINGS  Straightening of the usual cervical lordosis. This may be due to patient positioning but ligamentous injury or muscle spasm could also have this appearance and are not excluded. Slight anterior subluxation of C7 on T1 is unchanged since prior study. Normal alignment of the facet joints. Degenerative changes throughout the cervical spine with narrowed cervical interspaces and associated endplate hypertrophic changes. Degenerative changes in the cervical facet joints. C1-2  articulation appears intact. There is some compression of the lateral masses of C1 resulting in basilar invagination of the odontoid process in the foramen magnum. Erosion at the base of the dens posteriorly. Changes are consistent with rheumatoid arthritis. These changes have progressed since previous study under not likely to represent acute posttraumatic change. Otherwise, no vertebral compression deformities are demonstrated. Diffuse bone demineralization. No prevertebral soft tissue swelling. Vascular calcifications in the cervical carotid vessels. Large right thyroid mass extending into the thoracic inlet, similar to prior study.  IMPRESSION: No acute intracranial abnormalities. Chronic atrophy and small vessel ischemic changes.  Nonspecific straightening of the usual cervical lordosis. Diffuse bone demineralization and degenerative changes throughout the cervical spine. Interval development/ progression of erosive changes at the base of the odontoid process of C2 and compression of the lateral masses of C1 resulting in basilar invagination. No acute displaced fractures are demonstrated.   Electronically Signed   By: Burman Nieves M.D.   On: 08/03/2014 21:37   Ct Cervical Spine Wo Contrast  08/03/2014   CLINICAL DATA:  Fall without loss of consciousness at 6 p.m. this evening. Trip and fall injury. Pain. Confusion and dementia is chronic.  EXAM: CT HEAD WITHOUT CONTRAST  CT CERVICAL SPINE WITHOUT CONTRAST  TECHNIQUE: Multidetector CT imaging of the head and cervical spine was performed following the standard protocol without intravenous contrast. Multiplanar CT image reconstructions of the cervical spine were also generated.  COMPARISON:  CT head 05/20/2014. CT head and cervical spine 12/01/2010.  FINDINGS: CT HEAD FINDINGS  Diffuse cerebral atrophy. Mild ventricular dilatation consistent with central atrophy. Low-attenuation changes throughout the deep white matter consistent with small vessel ischemia.  Patchy areas of encephalomalacia in the left frontal lobe consistent with old infarct and unchanged since prior study. No mass effect or midline shift. No abnormal extra-axial fluid collections. Gray-white matter  junctions are distinct. Basal cisterns are not effaced. No evidence of acute intracranial hemorrhage. No depressed skull fractures. Visualized paranasal sinuses and mastoid air cells are not opacified. Vascular calcifications.  CT CERVICAL SPINE FINDINGS  Straightening of the usual cervical lordosis. This may be due to patient positioning but ligamentous injury or muscle spasm could also have this appearance and are not excluded. Slight anterior subluxation of C7 on T1 is unchanged since prior study. Normal alignment of the facet joints. Degenerative changes throughout the cervical spine with narrowed cervical interspaces and associated endplate hypertrophic changes. Degenerative changes in the cervical facet joints. C1-2 articulation appears intact. There is some compression of the lateral masses of C1 resulting in basilar invagination of the odontoid process in the foramen magnum. Erosion at the base of the dens posteriorly. Changes are consistent with rheumatoid arthritis. These changes have progressed since previous study under not likely to represent acute posttraumatic change. Otherwise, no vertebral compression deformities are demonstrated. Diffuse bone demineralization. No prevertebral soft tissue swelling. Vascular calcifications in the cervical carotid vessels. Large right thyroid mass extending into the thoracic inlet, similar to prior study.  IMPRESSION: No acute intracranial abnormalities. Chronic atrophy and small vessel ischemic changes.  Nonspecific straightening of the usual cervical lordosis. Diffuse bone demineralization and degenerative changes throughout the cervical spine. Interval development/ progression of erosive changes at the base of the odontoid process of C2 and compression of  the lateral masses of C1 resulting in basilar invagination. No acute displaced fractures are demonstrated.   Electronically Signed   By: Burman Nieves M.D.   On: 08/03/2014 21:37   Dg Abd 2 Views  07/30/2014   CLINICAL DATA:  Chronic diarrhea.  Generalized abdominal pain.  EXAM: ABDOMEN - 2 VIEW  COMPARISON:  CT 07/19/2011.  FINDINGS: Bowel gas pattern does not show evidence of ileus, obstruction or free air. There does not appear to be any significant amount of stool. Calcifications over the right upper quadrant relates to costal cartilage. There is extensive vascular calcification throughout the region. Clips are present in the right upper quadrant related to previous cholecystectomy previous vertebral augmentation at L1 and L2.  IMPRESSION: Gas pattern is unremarkable.  Normal, small amount of stool.   Electronically Signed   By: Paulina Fusi M.D.   On: 07/30/2014 15:38    Microbiology: No results found for this or any previous visit (from the past 240 hour(s)).   Labs: Basic Metabolic Panel:  Recent Labs Lab 08/03/14 2057 08/04/14 0522 08/05/14 1235 08/06/14 0434  NA 139 135 140 141  K 4.2 4.4 4.3 4.3  CL 105 104 111 108  CO2 GLUCOSE 135* 117* 138* 103*  BUN 30* 32* 39* 40*  CREATININE 1.50* 1.43* 1.75* 1.67*  CALCIUM 8.8 8.8 8.9 8.6  MG  --  1.9  --   --   PHOS  --  4.5  --   --    Liver Function Tests:  Recent Labs Lab 08/04/14 0522  AST 20  ALT 8  ALKPHOS 137*  BILITOT 1.2  PROT 6.5  ALBUMIN 4.2   No results for input(s): LIPASE, AMYLASE in the last 168 hours. No results for input(s): AMMONIA in the last 168 hours. CBC:  Recent Labs Lab 08/03/14 2057 08/04/14 0522 08/05/14 1235 08/06/14 0434  WBC 9.2 8.8 7.7 6.0  NEUTROABS 7.9*  --   --   --   HGB 11.3* 10.9* 9.8* 9.5*  HCT 35.4* 34.5* 30.7* 29.6*  MCV 100.3* 100.9* 101.0* 101.7*  PLT 154 124* 107* 92*   Cardiac Enzymes: No results for input(s): CKTOTAL, CKMB, CKMBINDEX, TROPONINI in  the last 168 hours. BNP: BNP (last 3 results) No results for input(s): PROBNP in the last 8760 hours. CBG: No results for input(s): GLUCAP in the last 168 hours.  Time coordinating discharge: Over 30 minutes

## 2014-08-06 NOTE — Progress Notes (Signed)
[facility] bed available at [facility] [place] today. [employer] medicare has provided authorization for placement. Authorization for [facility] transport has been requested. Daughter, Karoline Caldwellngie , contacted and is in agreement with d/c plan. NSG reviewed d/c summary, avs , scripts. Scripts have been included in d/c packet.  Cori RazorJamie Elery Cadenhead LCSW [phone]

## 2014-08-06 NOTE — Progress Notes (Signed)
Clinical Social Work Department CLINICAL SOCIAL WORK PLACEMENT NOTE 08/06/2014  Patient:  [name],[name]  Account Number:  [id number] Admit date:  08/03/2014  Clinical Social Worker:  Cori RazorJAMIE Tyrisha Benninger, LCSW  Date/time:  08/05/2014 12:00 M  Clinical Social Work is seeking post-discharge placement for this patient at the following level of care:   SKILLED NURSING   (*CSW will update this form in Epic as items are completed)   08/05/2014  Patient/family provided with [facility] Department of Clinical Social Work's list of facilities offering this level of care within the geographic area requested by the patient (or if unable, by the patient's family).    Patient/family informed of their freedom to choose among providers that offer the needed level of care, that participate in Medicare, Medicaid or managed care program needed by the patient, have an available bed and are willing to accept the patient.    Patient/family informed of [facility]' ownership interest in [facility], as well as of the fact that they are under no obligation to receive care at this facility.  PASARR submitted to EDS on 08/05/2014 PASARR number received on 08/05/2014  FL2 transmitted to all facilities in geographic area requested by pt/family on  08/05/2014 FL2 transmitted to all facilities within larger geographic area on   Patient informed that his/her managed care company has contracts with or will negotiate with  certain facilities, including the following:     Patient/family informed of bed offers received:  08/06/2014 [facility] [place] Physician recommends and patient chooses bed at    [facility] [place] on  08/06/2014 Patient to be transferred to facility by [facility] Patient and family notified of transfer on 08/06/2014 Name of family member notified:  Daughter  The following physician request were entered in Epic:   Additional  Comments:

## 2014-08-06 NOTE — Progress Notes (Signed)
Assessment unchanged. Discharged via stretcher and accompanied by EMS staff to meeting awaiting [facility] [place]. Packet of information prepared by SW given to EMS to present upon arrival to facility.

## 2014-08-06 NOTE — Discharge Instructions (Signed)

## 2014-08-07 ENCOUNTER — Encounter: Payer: Self-pay | Admitting: Interventional Cardiology

## 2014-08-08 LAB — URINE CULTURE: Special Requests: NORMAL

## 2014-08-30 ENCOUNTER — Ambulatory Visit: Payer: Medicare Other | Admitting: Interventional Cardiology

## 2014-08-30 ENCOUNTER — Encounter: Payer: Medicare Other | Admitting: Internal Medicine

## 2014-11-04 ENCOUNTER — Encounter: Payer: Self-pay | Admitting: Internal Medicine

## 2014-11-06 ENCOUNTER — Ambulatory Visit: Payer: Self-pay | Admitting: Interventional Cardiology

## 2014-12-26 ENCOUNTER — Ambulatory Visit: Payer: Medicare Other | Admitting: Neurology

## 2015-01-16 ENCOUNTER — Ambulatory Visit (INDEPENDENT_AMBULATORY_CARE_PROVIDER_SITE_OTHER): Payer: Medicaid Other | Admitting: Internal Medicine

## 2015-01-16 ENCOUNTER — Encounter: Payer: Self-pay | Admitting: Interventional Cardiology

## 2015-01-16 ENCOUNTER — Ambulatory Visit: Payer: Self-pay | Admitting: Interventional Cardiology

## 2015-01-16 ENCOUNTER — Ambulatory Visit (INDEPENDENT_AMBULATORY_CARE_PROVIDER_SITE_OTHER): Payer: Medicare Other | Admitting: Interventional Cardiology

## 2015-01-16 VITALS — BP 110/70 | HR 67 | Ht 60.0 in | Wt 115.0 lb

## 2015-01-16 DIAGNOSIS — N183 Chronic kidney disease, stage 3 unspecified: Secondary | ICD-10-CM

## 2015-01-16 DIAGNOSIS — I5022 Chronic systolic (congestive) heart failure: Secondary | ICD-10-CM | POA: Diagnosis not present

## 2015-01-16 DIAGNOSIS — Z45018 Encounter for adjustment and management of other part of cardiac pacemaker: Secondary | ICD-10-CM

## 2015-01-16 DIAGNOSIS — I4821 Permanent atrial fibrillation: Secondary | ICD-10-CM

## 2015-01-16 DIAGNOSIS — I442 Atrioventricular block, complete: Secondary | ICD-10-CM | POA: Diagnosis not present

## 2015-01-16 DIAGNOSIS — F0390 Unspecified dementia without behavioral disturbance: Secondary | ICD-10-CM

## 2015-01-16 DIAGNOSIS — I482 Chronic atrial fibrillation, unspecified: Secondary | ICD-10-CM

## 2015-01-16 LAB — CUP PACEART INCLINIC DEVICE CHECK
Battery Voltage: 2.71 V
Lead Channel Impedance Value: 362 Ohm
Lead Channel Pacing Threshold Amplitude: 1.12 V
Lead Channel Pacing Threshold Pulse Width: 0.4 ms
MDC IDC MSMT BATTERY IMPEDANCE: 6700 Ohm
MDC IDC SESS DTM: 20160616132203
MDC IDC SET LEADCHNL RV PACING PULSEWIDTH: 0.4 ms
MDC IDC SET LEADCHNL RV SENSING SENSITIVITY: 4 mV
Pulse Gen Model: 5156
Pulse Gen Serial Number: 777915

## 2015-01-16 LAB — BASIC METABOLIC PANEL
BUN: 41 mg/dL — AB (ref 6–23)
CHLORIDE: 102 meq/L (ref 96–112)
CO2: 32 mEq/L (ref 19–32)
Calcium: 9.7 mg/dL (ref 8.4–10.5)
Creatinine, Ser: 1.24 mg/dL — ABNORMAL HIGH (ref 0.40–1.20)
GFR: 44.19 mL/min — ABNORMAL LOW (ref >60.00)
GLUCOSE: 97 mg/dL (ref 70–99)
Potassium: 3.9 mEq/L (ref 3.5–5.1)
Sodium: 140 mEq/L (ref 135–145)

## 2015-01-16 NOTE — Patient Instructions (Addendum)
Medication Instructions:  Your physician recommends that you continue on your current medications as directed. Please refer to the Current Medication list given to you today.   Labwork: NONE  Testing/Procedures: NONE  Follow-Up: Your physician wants you to follow-up in: 6 months in [facility]. You will receive a reminder letter in the mail two months in advance. If you don't receive a letter, please call our office to schedule the follow-up appointment.  Your physician wants you to follow-up in: 12 months with Gypsy Balsam, NP You will receive a reminder letter in the mail two months in advance. If you don't receive a letter, please call our office to schedule the follow-up appointment.   Any Other Special Instructions Will Be Listed Below (If Applicable).

## 2015-01-16 NOTE — Progress Notes (Signed)
Patient Care Team: Ailene Ravel, MD as PCP - General (Family Medicine)   HPI  [name] is a 81 y.o. female Seen in followup for pacer implanted years ago with generator replacement 2004  Has permanent atrial fibrillation status post AV junction ablation. Her dual chamber device was replaced at her last procedure was a single chamber device.   Echocardiogram 9/13 demonstrated an ejection fraction 25-30%.; This is a significant interval decrease in at 03/12 her ejection fraction was 50%. Repeat echocardiogram 10/15 was reviewed and restrained severe biatrial enlargement modest pulmonary hypertension and an ejection fraction of 35% with multiple wall motion abnormalities.  She has marked limitations in exercise tolerance. In part this relates to fatigue the following day.   Her daughter recounts the saga of the last year. She ended up having a stroke 10/15. Notes from this hospitalization were reviewed including recommendations from neurology and imaging studies   Decisions regarding anticoagulation had been raised at the last visit but deferred to primary cardiology. She is now on apixaban. She is wheelchair-bound. In the last few weeks she has had a episode of congestive heart failure manifested by increasing edema and treated with a titration of her diuretics. No hospital evaluation was undertaken    Past Medical History  Diagnosis Date  . Coronary artery disease   . Hypertension   . Heart murmur   . Shortness of breath   . Dysrhythmia     atrial fib  . Arthritis   . Neuromuscular disorder     parkinsons  . CHF (congestive heart failure)   . Atrial fibrillation   . Gout   . Osteoporosis   . Hypomagnesemia     Hx.  . Mild memory disturbance   . Peptic ulcer disease   . Degenerative arthritis   . Thyroid goiter     resection  . Parkinson disease     Past Surgical History  Procedure Laterality Date  . Pacemaker placement    . Insert / replace / remove  pacemaker    . Abdominal hysterectomy    . Cholecystectomy    . Toe nails      removed  . Bladder tack    . Appendectomy    . Tonsillectomy    . Kyphoplasty      Compression fracture, lumbosacral spine  . Rectocele repair    . Cataract extraction, bilateral    . Hiatal hernia repair      Current Outpatient Prescriptions  Medication Sig Dispense Refill  . acetaminophen (TYLENOL) 500 MG tablet Take 1,000 mg by mouth every 6 (six) hours as needed for moderate pain.    Marland Kitchen allopurinol (ZYLOPRIM) 100 MG tablet Take 200 mg by mouth daily.     Marland Kitchen alum & mag hydroxide-simeth (MAALOX/MYLANTA) 200-200-20 MG/5ML suspension Take 30 mLs by mouth every 6 (six) hours as needed for indigestion or heartburn.    Marland Kitchen aMILoride (MIDAMOR) 5 MG tablet Take 5 mg by mouth 3 (three) times a week. Monday, Wednesday and Friday.    Marland Kitchen apixaban (ELIQUIS) 2.5 MG TABS tablet Take 1 tablet (2.5 mg total) by mouth 2 (two) times daily. 60 tablet 0  . bismuth subsalicylate (PEPTO BISMOL) 262 MG/15ML suspension Take 10 mLs by mouth every 8 (eight) hours as needed for indigestion.    . Calcium Carb-Cholecalciferol (CALCIUM + D3) 600-200 MG-UNIT TABS Take 1 tablet by mouth daily.    . carbidopa-levodopa (SINEMET IR) 25-100 MG per tablet Take 1  tablet by mouth 3 (three) times daily. 90 tablet 6  . feeding supplement, ENSURE COMPLETE, (ENSURE COMPLETE) LIQD Take 237 mLs by mouth 2 (two) times daily between meals. 237 mL 0  . HYDROcodone-acetaminophen (NORCO/VICODIN) 5-325 MG per tablet Take 1-2 tablets by mouth every 4 (four) hours as needed for moderate pain. 30 tablet 0  . linezolid (ZYVOX) 600 MG tablet   0  . loperamide (IMODIUM) 2 MG capsule Take 2 mg by mouth daily as needed for diarrhea or loose stools.    . magnesium oxide (MAG-OX) 400 MG tablet Take 400 mg by mouth 2 (two) times daily. Taking 3 (400 mg) tablets twice daily    . metoprolol (LOPRESSOR) 25 MG tablet Take 1 tablet (25 mg total) by mouth 2 (two) times daily.      . potassium chloride (MICRO-K) 10 MEQ CR capsule   98  . PREVALITE 4 G packet Take 4 g by mouth daily.    Marland Kitchen senna-docusate (SENOKOT-S) 8.6-50 MG per tablet Take 1 tablet by mouth at bedtime as needed for moderate constipation.    . simvastatin (ZOCOR) 20 MG tablet Take 1 tablet (20 mg total) by mouth at bedtime. 30 tablet 0   No current facility-administered medications for this visit.    No Known Allergies  Review of Systems negative except from HPI and PMH  Physical Exam BP 110/70 mmHg  Pulse 67  Ht 5' (1.524 m)  Wt 115 lb (52.164 kg)  BMI 22.46 kg/m2  SpO2 97% Well developed and cachectic in no acute distress sitting in a wheelchair HENT normal E scleral and icterus clear Neck Supple JVP flat; carotids brisk and full Clear to ausculation Device pocket well healed; without hematoma or erythema.  There is no tethering   Kyphosis Regular rate and rhythm, no murmurs gallops or rub Soft with active bowel sounds No clubbing cyanosis Trace Edema Alert and oriented, grossly normal motor and sensory function Skin Warm and Dry  ECG atrial fibrillation with ventricular pacing at 75 per minute [name]  Assessment and  Plan\  Atrial fibrillation-permanent  Complete heart block status post AV junction ablation  Coronary artery disease  Congestive heart failure-chronic-systolic  Cardiomyopathy -Ischemic  Pacemaker-[employer]   She is now anticoagulated with apixaban on the appropriate dose Device is approaching ERI. We will make sure that we have established remote follow-up.  As relates to her cardiomyopathy, she is on metoprolol tartrate; I will defer to Dr. Katrinka Blazing as to whether he would like to adjust this. Blood pressure is not sufficiently elevated that would likely support the addition of an ACE inhibitor.  I have reviewed with the patient and her daughter the likely contribution of her cardiomyopathy to her episodes of congestive heart failure. Notably, she is  euvolemic today.  She is on statin therapy albeit low-dose

## 2015-01-16 NOTE — Patient Instructions (Signed)
Medication Instructions:  Your physician recommends that you continue on your current medications as directed. Please refer to the Current Medication list given to you today.  Labwork: Bmet today  Testing/Procedures: None   Follow-Up: Your physician wants you to follow-up in: 1 year with Dr.Smith You will receive a reminder letter in the mail two months in advance. If you don't receive a letter, please call our office to schedule the follow-up appointment.   Any Other Special Instructions Will Be Listed Below (If Applicable).   

## 2015-01-16 NOTE — Progress Notes (Signed)
Cardiology Office Note   Date:  01/16/2015   ID:  [name], DOB 23-Feb-1933, MRN [id number]  PCP:  Melinda Rakes, MD  Cardiologist:  Melinda Noe, MD   Chief Complaint  Patient presents with  . Congestive Heart Failure      History of Present Illness: [name] is a 81 y.o. female who presents for chronic atrial fibrillation, recent embolic CVA, chronic anticoagulation therapy, chronic systolic heart failure, dementia, immobility.  She is a copy by nurse from the long-term care facility, [facility]. Patient has had a stroke and is also had a subsequent fall with pelvic fracture since the last time I saw her. She was seen this morning in device clinic by Dr. Graciela Hickman.  She denies dyspnea. Appetite is been stable. She denies chest pain. She has not had blood in her urine or stool.    Past Medical History  Diagnosis Date  . Coronary artery disease   . Hypertension   . Heart murmur   . Shortness of breath   . Dysrhythmia     atrial fib  . Arthritis   . Neuromuscular disorder     parkinsons  . CHF (congestive heart failure)   . Atrial fibrillation   . Gout   . Osteoporosis   . Hypomagnesemia     Hx.  . Mild memory disturbance   . Peptic ulcer disease   . Degenerative arthritis   . Thyroid goiter     resection  . Parkinson disease     Past Surgical History  Procedure Laterality Date  . Pacemaker placement    . Insert / replace / remove pacemaker    . Abdominal hysterectomy    . Cholecystectomy    . Toe nails      removed  . Bladder tack    . Appendectomy    . Tonsillectomy    . Kyphoplasty      Compression fracture, lumbosacral spine  . Rectocele repair    . Cataract extraction, bilateral    . Hiatal hernia repair       Current Outpatient Prescriptions  Medication Sig Dispense Refill  . acetaminophen (TYLENOL) 500 MG tablet Take 1,000 mg by mouth every 6 (six) hours as needed for moderate pain.    Marland Kitchen allopurinol  (ZYLOPRIM) 100 MG tablet Take 200 mg by mouth daily.     Marland Kitchen alum & mag hydroxide-simeth (MAALOX/MYLANTA) 200-200-20 MG/5ML suspension Take 30 mLs by mouth every 6 (six) hours as needed for indigestion or heartburn.    Marland Kitchen aMILoride (MIDAMOR) 5 MG tablet Take 5 mg by mouth 3 (three) times a week. Monday, Wednesday and Friday.    Marland Kitchen apixaban (ELIQUIS) 2.5 MG TABS tablet Take 1 tablet (2.5 mg total) by mouth 2 (two) times daily. 60 tablet 0  . bismuth subsalicylate (PEPTO BISMOL) 262 MG/15ML suspension Take 10 mLs by mouth every 8 (eight) hours as needed for indigestion.    . Calcium Carb-Cholecalciferol (CALCIUM + D3) 600-200 MG-UNIT TABS Take 1 tablet by mouth daily.    . carbidopa-levodopa (SINEMET IR) 25-100 MG per tablet Take 1 tablet by mouth 3 (three) times daily. 90 tablet 6  . feeding supplement, ENSURE COMPLETE, (ENSURE COMPLETE) LIQD Take 237 mLs by mouth 2 (two) times daily between meals. 237 mL 0  . HYDROcodone-acetaminophen (NORCO/VICODIN) 5-325 MG per tablet Take 1-2 tablets by mouth every 4 (four) hours as needed for moderate pain. 30 tablet 0  . linezolid (ZYVOX) 600 MG tablet  0  . loperamide (IMODIUM) 2 MG capsule Take 2 mg by mouth daily as needed for diarrhea or loose stools.    . magnesium oxide (MAG-OX) 400 MG tablet Take 400 mg by mouth 2 (two) times daily. Taking 3 (400 mg) tablets twice daily    . metoprolol (LOPRESSOR) 25 MG tablet Take 1 tablet (25 mg total) by mouth 2 (two) times daily.    . potassium chloride (MICRO-K) 10 MEQ CR capsule   98  . PREVALITE 4 G packet Take 4 g by mouth daily.    Marland Kitchen senna-docusate (SENOKOT-S) 8.6-50 MG per tablet Take 1 tablet by mouth at bedtime as needed for moderate constipation.    . simvastatin (ZOCOR) 20 MG tablet Take 1 tablet (20 mg total) by mouth at bedtime. 30 tablet 0   No current facility-administered medications for this visit.    Allergies:   Review of patient's allergies indicates no known allergies.    Social History:  The  patient  reports that she has never smoked. She has never used smokeless tobacco. She reports that she does not drink alcohol or use illicit drugs.   Family History:  The patient's family history includes Diabetes in her mother; Heart attack in her father; Parkinsonism in her brother; Stroke in her sister.    ROS:  Please see the history of present illness.   Otherwise, review of systems are positive for difficulty with memory, difficulty accepting assistance with ambulation..   All other systems are reviewed and negative.    PHYSICAL EXAM: VS:  BP 110/70 mmHg  Pulse 67  Ht 5' (1.524 m)  Wt 52.164 kg (115 lb)  BMI 22.46 kg/m2 , BMI Body mass index is 22.46 kg/(m^2). GEN: Well nourished, well developed, in no acute distress HEENT: normal Neck: no JVD, carotid bruits, or masses Cardiac: IRR; no murmurs, rubs, or gallops,no edema  Respiratory:  clear to auscultation bilaterally, normal work of breathing GI: soft, nontender, nondistended, + BS MS: no deformity or atrophy Skin: warm and dry, no rash Neuro:  Strength and sensation are intact Psych: euthymic mood, full affect   EKG:  EKG is not ordered today.   Recent Labs: 08/04/2014: ALT 8; Magnesium 1.9; TSH 1.359 08/06/2014: BUN 40*; Creatinine, Ser 1.67*; Hemoglobin 9.5*; Platelets 92*; Potassium 4.3; Sodium 141    Lipid Panel    Component Value Date/Time   CHOL 130 05/20/2014 0455   TRIG 70 05/20/2014 0455   HDL 46 05/20/2014 0455   CHOLHDL 2.8 05/20/2014 0455   VLDL 14 05/20/2014 0455   LDLCALC 70 05/20/2014 0455      Wt Readings from Last 3 Encounters:  01/16/15 52.164 kg (115 lb)  01/16/15 52.164 kg (115 lb)  08/03/14 52.9 kg (116 lb 10 oz)      Other studies Reviewed: Additional studies/ records that were reviewed today include: .    ASSESSMENT AND PLAN:  Dementia, without behavioral disturbance  Complete heart block- s/p AV ablation  Chronic systolic heart failure - no evidence of volume  overload  Chronic atrial fibrillation - controlled  CKD (chronic kidney disease) stage 3, GFR 30-59 ml/min     Current medicines are reviewed at length with the patient today.  The patient does not have concerns regarding medicines.  The following changes have been made:  Continue current medical regimen. We will do a basic metabolic panel today.  Labs/ tests ordered today include:  No orders of the defined types were placed in this encounter.  Disposition:   FU with HS  in 1 year  Signed, Melinda Noe, MD  01/16/2015 10:29 AM    [facility] [address] [place], [place], [place]  [address] Phone: [phone]; Fax: [phone]

## 2015-01-17 ENCOUNTER — Telehealth: Payer: Self-pay

## 2015-01-17 NOTE — Telephone Encounter (Signed)
-----   Message from Lyn Records, MD sent at 01/16/2015 12:57 PM EDT ----- Labs are stable.

## 2015-01-17 NOTE — Telephone Encounter (Signed)
Called to give pt lab results.lmtcb  

## 2015-01-31 DEATH — deceased

## 2015-02-10 ENCOUNTER — Encounter: Payer: Self-pay | Admitting: Internal Medicine

## 2015-10-22 IMAGING — CR DG HIP COMPLETE 2+V*R*
2 series · 2 of 2 positions shown · non-contrast
Comparison: 12/01/2010.

CLINICAL DATA: Initial encounter for tripped and fall walking to
bathroom. Hip right hip on floor.

EXAM:
RIGHT HIP - COMPLETE 2+ VIEW

[t hip frog leg right]
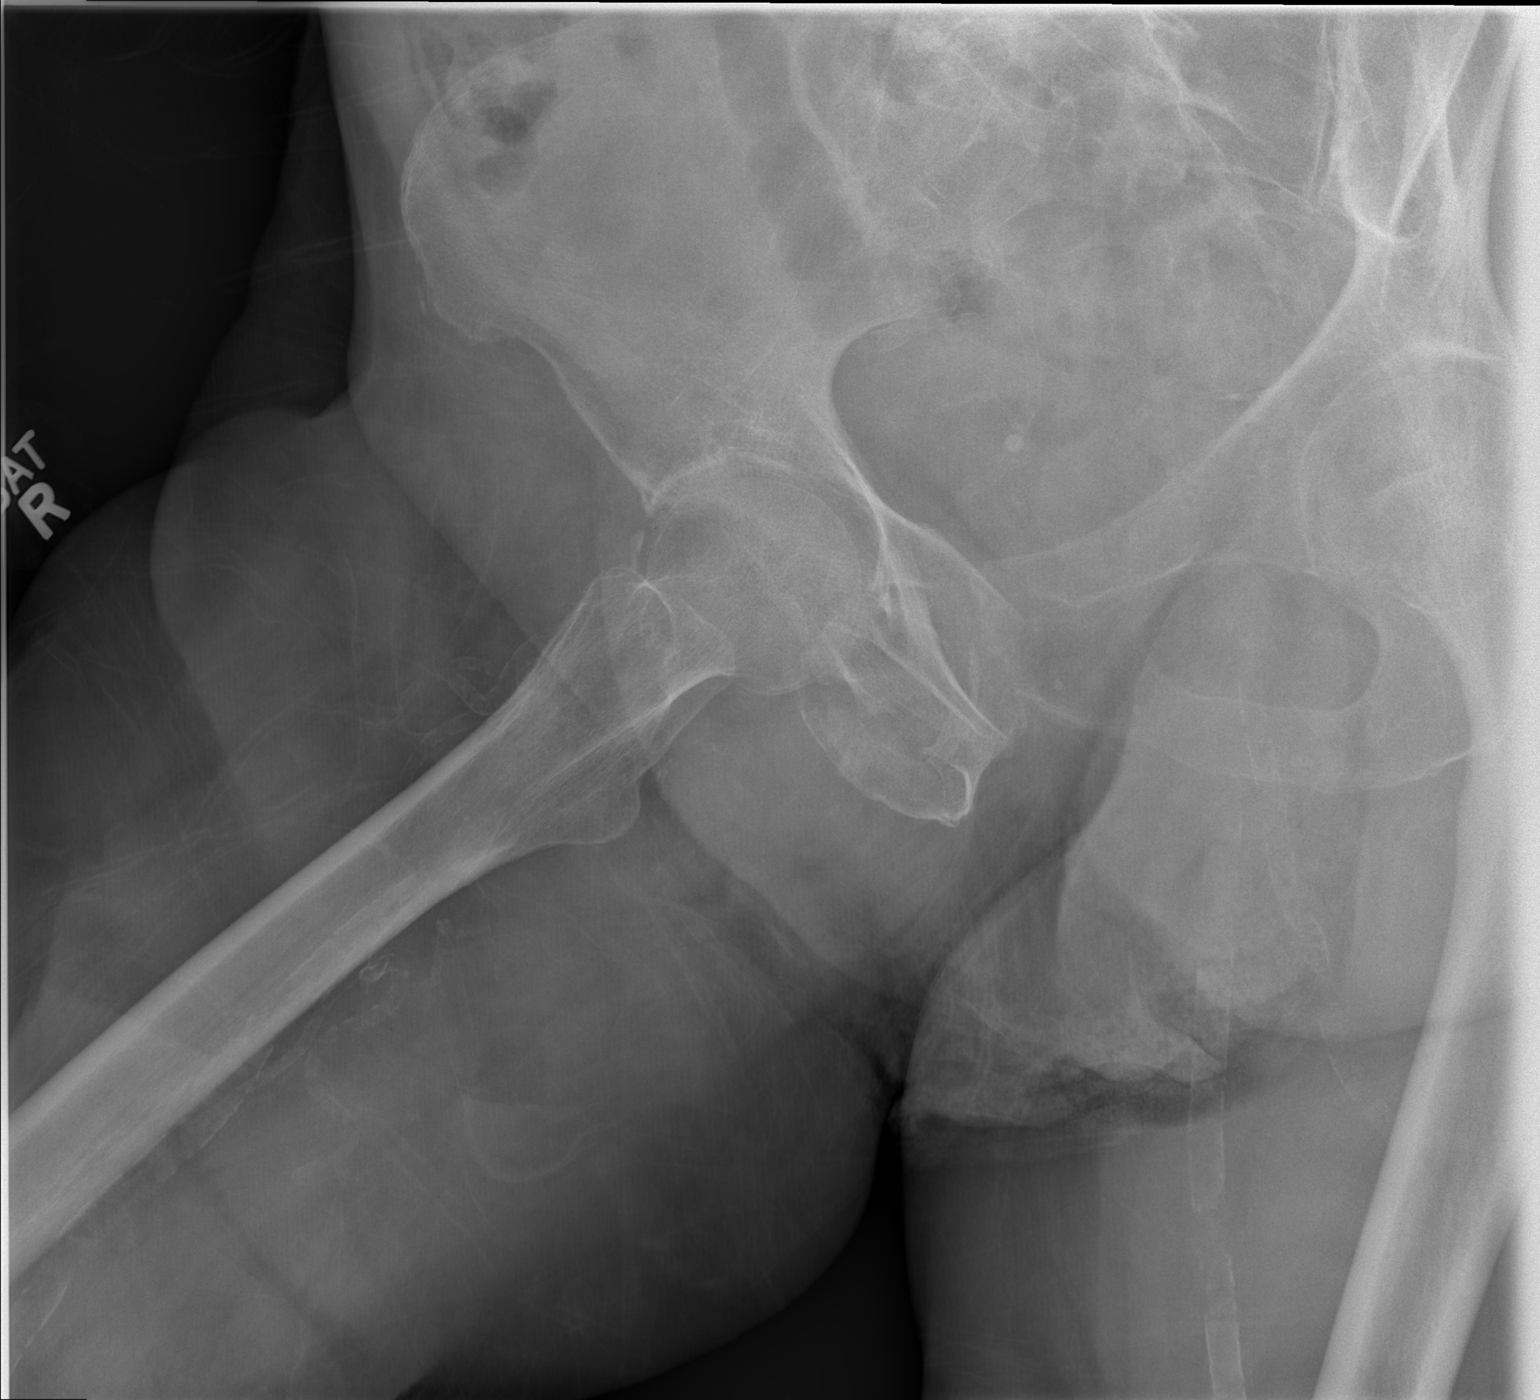

[t hip ap right]
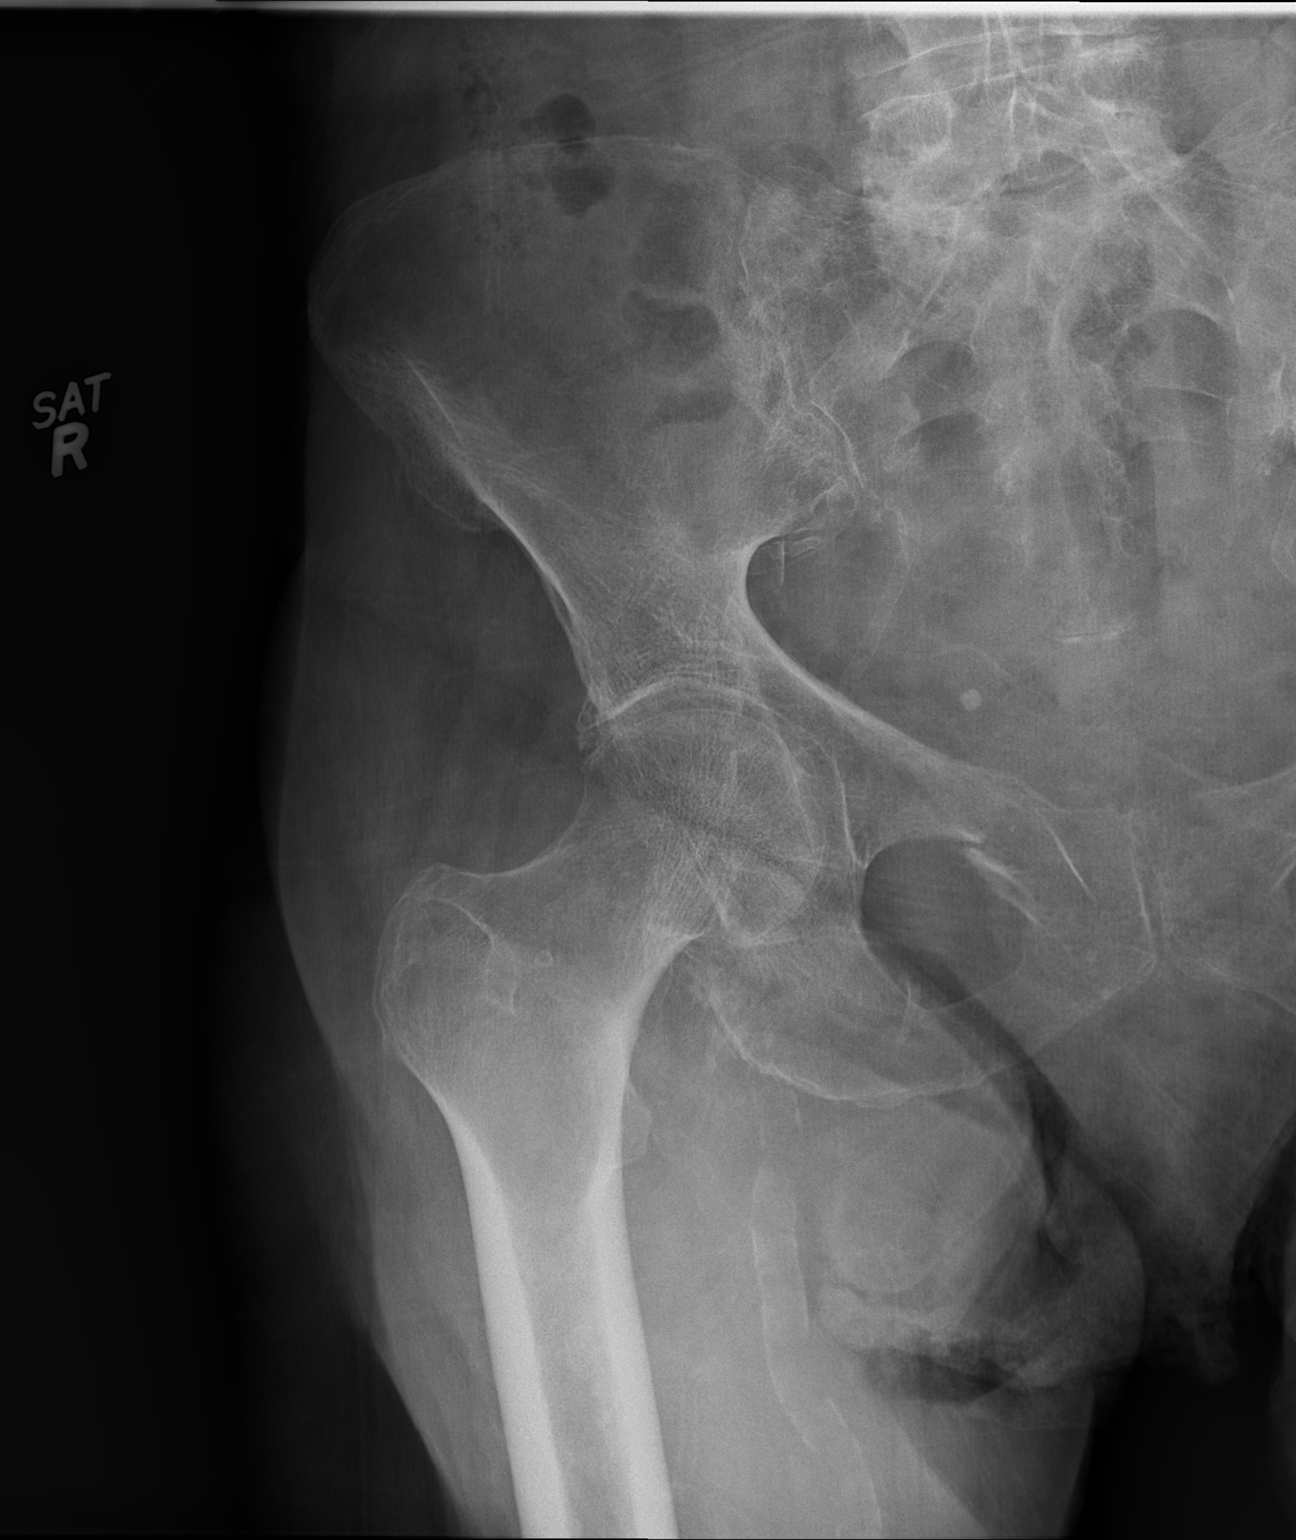

[2 of 2 positions shown; findings below may reference images not displayed]

FINDINGS: AP and frog-leg lateral views of the right hip show a comminuted
fracture of the right superior pubic ramus. There is associated
inferior pubic ramus on the right.

No evidence for femoral neck fracture.

Bones are diffusely demineralized.
IMPRESSION: Acute fractures of the right superior and inferior pubic rami.

## 2015-10-22 IMAGING — CT CT CERVICAL SPINE W/O CM
2 of 4 series · 5 of 14 positions shown, 6 images · non-contrast
Comparison: CT head 05/20/2014. CT head and cervical spine
12/01/2010.

CLINICAL DATA: Fall without loss of consciousness at 6 p.m. this
evening. Trip and fall injury. Pain. Confusion and dementia is
chronic.

EXAM:
CT HEAD WITHOUT CONTRAST
CT CERVICAL SPINE WITHOUT CONTRAST
TECHNIQUE: Multidetector CT imaging of the head and cervical spine was
performed following the standard protocol without intravenous
contrast. Multiplanar CT image reconstructions of the cervical spine
were also generated.

[Series 5: c-spine st · axial · 0.24mm/px · z∈[-240,-192]mm · 2 of 67 slices shown]
[im 23/67  bone]
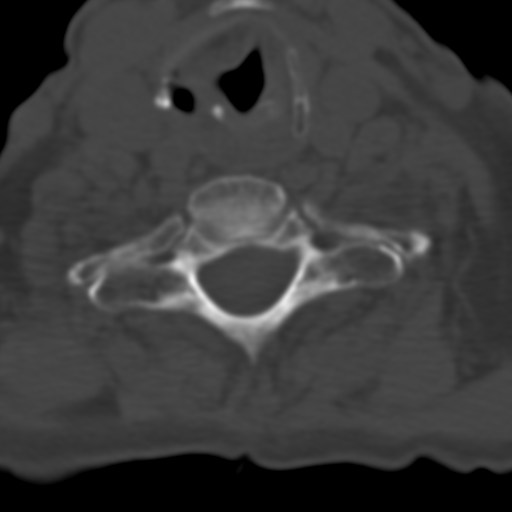
[im 45/67  bone]
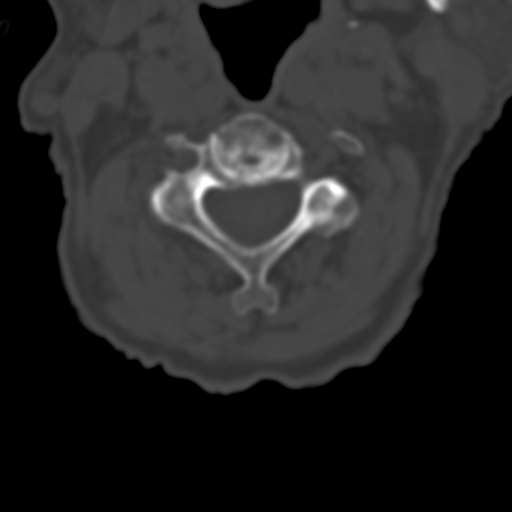

[Series 8: axial recon · axial · 0.23mm/px · z∈[-276,-194]mm · 3 of 87 slices shown, 4 images]
[im 22/87  soft-tissue]
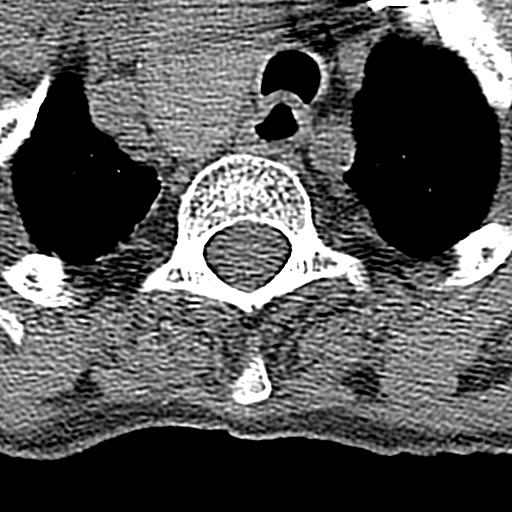
[im 22/87  bone]
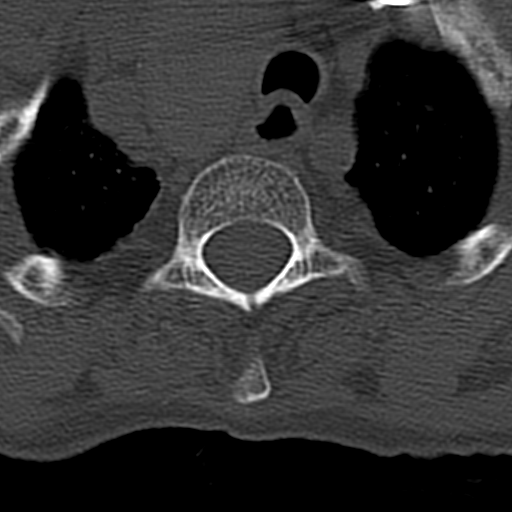
[im 44/87  bone]
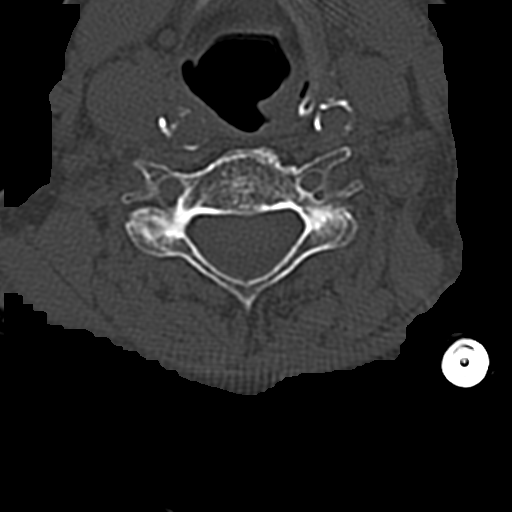
[im 65/87  bone]
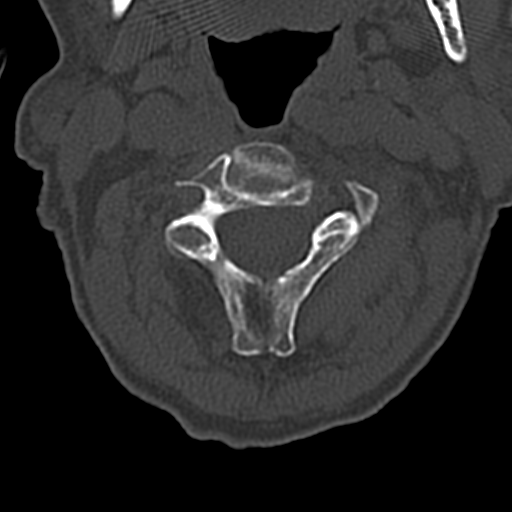

[5 of 14 positions shown; findings below may reference images not displayed]

FINDINGS: CT HEAD FINDINGS

Diffuse cerebral atrophy. Mild ventricular dilatation consistent
with central atrophy. Low-attenuation changes throughout the deep
white matter consistent with small vessel ischemia. Patchy areas of
encephalomalacia in the left frontal lobe consistent with old
infarct and unchanged since prior study. No mass effect or midline
shift. No abnormal extra-axial fluid collections. Gray-white matter
junctions are distinct. Basal cisterns are not effaced. No evidence
of acute intracranial hemorrhage. No depressed skull fractures.
Visualized paranasal sinuses and mastoid air cells are not
opacified. Vascular calcifications.

CT CERVICAL SPINE FINDINGS

Straightening of the usual cervical lordosis. This may be due to
patient positioning but ligamentous injury or muscle spasm could
also have this appearance and are not excluded. Slight anterior
subluxation of C7 on T1 is unchanged since prior study. Normal
alignment of the facet joints. Degenerative changes throughout the
cervical spine with narrowed cervical interspaces and associated
endplate hypertrophic changes. Degenerative changes in the cervical
facet joints. C1-2 articulation appears intact. There is some
compression of the lateral masses of C1 resulting in basilar
invagination of the odontoid process in the foramen magnum. Erosion
at the base of the dens posteriorly. Changes are consistent with
rheumatoid arthritis. These changes have progressed since previous
study under not likely to represent acute posttraumatic change.
Otherwise, no vertebral compression deformities are demonstrated.
Diffuse bone demineralization. No prevertebral soft tissue swelling.
Vascular calcifications in the cervical carotid vessels. Large right
thyroid mass extending into the thoracic inlet, similar to prior
study.
IMPRESSION: No acute intracranial abnormalities. Chronic atrophy and small
vessel ischemic changes.

Nonspecific straightening of the usual cervical lordosis. Diffuse
bone demineralization and degenerative changes throughout the
cervical spine. Interval development/ progression of erosive changes
at the base of the odontoid process of C2 and compression of the
lateral masses of C1 resulting in basilar invagination. No acute
displaced fractures are demonstrated.
